# Patient Record
Sex: Female | Born: 1985 | Race: Black or African American | Hispanic: No | Marital: Single | Smoking: Former smoker
Health system: Southern US, Community
[De-identification: ages and names within clinical notes are randomized; demographics above are authoritative.]

## PROBLEM LIST (undated history)

## (undated) DIAGNOSIS — F32A Depression, unspecified: Secondary | ICD-10-CM

## (undated) DIAGNOSIS — D649 Anemia, unspecified: Secondary | ICD-10-CM

## (undated) DIAGNOSIS — O24419 Gestational diabetes mellitus in pregnancy, unspecified control: Secondary | ICD-10-CM

## (undated) DIAGNOSIS — D219 Benign neoplasm of connective and other soft tissue, unspecified: Secondary | ICD-10-CM

## (undated) DIAGNOSIS — F419 Anxiety disorder, unspecified: Secondary | ICD-10-CM

## (undated) HISTORY — PX: OTHER SURGICAL HISTORY: SHX169

## (undated) HISTORY — DX: Depression, unspecified: F32.A

## (undated) HISTORY — DX: Benign neoplasm of connective and other soft tissue, unspecified: D21.9

## (undated) HISTORY — DX: Anemia, unspecified: D64.9

## (undated) HISTORY — DX: Gestational diabetes mellitus in pregnancy, unspecified control: O24.419

## (undated) HISTORY — DX: Anxiety disorder, unspecified: F41.9

---

## 2017-01-01 ENCOUNTER — Encounter: Payer: Self-pay | Admitting: Women's Health

## 2017-01-01 ENCOUNTER — Ambulatory Visit (INDEPENDENT_AMBULATORY_CARE_PROVIDER_SITE_OTHER): Payer: Medicaid Other | Admitting: Women's Health

## 2017-01-01 VITALS — BP 118/66 | HR 84 | Ht 66.0 in | Wt 197.0 lb

## 2017-01-01 DIAGNOSIS — Z30011 Encounter for initial prescription of contraceptive pills: Secondary | ICD-10-CM

## 2017-01-01 DIAGNOSIS — Z3202 Encounter for pregnancy test, result negative: Secondary | ICD-10-CM

## 2017-01-01 DIAGNOSIS — Z332 Encounter for elective termination of pregnancy: Secondary | ICD-10-CM

## 2017-01-01 LAB — POCT URINE PREGNANCY: Preg Test, Ur: NEGATIVE

## 2017-01-01 MED ORDER — NORGESTIMATE-ETH ESTRADIOL 0.25-35 MG-MCG PO TABS: 1.0000 | ORAL_TABLET | Freq: Every day | ORAL | 11 refills | Status: DC

## 2017-01-01 NOTE — Patient Instructions (Signed)
Oral Contraception Use Oral contraceptive pills (OCPs) are medicines taken to prevent pregnancy. OCPs work by preventing the ovaries from releasing eggs. The hormones in OCPs also cause the cervical mucus to thicken, preventing the sperm from entering the uterus. The hormones also cause the uterine lining to become thin, not allowing a fertilized egg to attach to the inside of the uterus. OCPs are highly effective when taken exactly as prescribed. However, OCPs do not prevent sexually transmitted diseases (STDs). Safe sex practices, such as using condoms along with an OCP, can help prevent STDs. Before taking OCPs, you may have a physical exam and Pap test. Your health care provider may also order blood tests if necessary. Your health care provider will make sure you are a good candidate for oral contraception. Discuss with your health care provider the possible side effects of the OCP you may be prescribed. When starting an OCP, it can take 2 to 3 months for the body to adjust to the changes in hormone levels in your body. How to take oral contraceptive pills Your health care provider may advise you on how to start taking the first cycle of OCPs. Otherwise, you can:  Start on day 1 of your menstrual period. You will not need any backup contraceptive protection with this start time.  Start on the first Sunday after your menstrual period or the day you get your prescription. In these cases, you will need to use backup contraceptive protection for the first week.  Start the pill at any time of your cycle. If you take the pill within 5 days of the start of your period, you are protected against pregnancy right away. In this case, you will not need a backup form of birth control. If you start at any other time of your menstrual cycle, you will need to use another form of birth control for 7 days. If your OCP is the type called a minipill, it will protect you from pregnancy after taking it for 2 days (48  hours).  After you have started taking OCPs:  If you forget to take 1 pill, take it as soon as you remember. Take the next pill at the regular time.  If you miss 2 or more pills, call your health care provider because different pills have different instructions for missed doses. Use backup birth control until your next menstrual period starts.  If you use a 28-day pack that contains inactive pills and you miss 1 of the last 7 pills (pills with no hormones), it will not matter. Throw away the rest of the non-hormone pills and start a new pill pack.  No matter which day you start the OCP, you will always start a new pack on that same day of the week. Have an extra pack of OCPs and a backup contraceptive method available in case you miss some pills or lose your OCP pack. Follow these instructions at home:  Do not smoke.  Always use a condom to protect against STDs. OCPs do not protect against STDs.  Use a calendar to mark your menstrual period days.  Read the information and directions that came with your OCP. Talk to your health care provider if you have questions. Contact a health care provider if:  You develop nausea and vomiting.  You have abnormal vaginal discharge or bleeding.  You develop a rash.  You miss your menstrual period.  You are losing your hair.  You need treatment for mood swings or depression.  You   get dizzy when taking the OCP.  You develop acne from taking the OCP.  You become pregnant. Get help right away if:  You develop chest pain.  You develop shortness of breath.  You have an uncontrolled or severe headache.  You develop numbness or slurred speech.  You develop visual problems.  You develop pain, redness, and swelling in the legs. This information is not intended to replace advice given to you by your health care provider. Make sure you discuss any questions you have with your health care provider. Document Released: 08/03/2011 Document  Revised: 01/20/2016 Document Reviewed: 02/02/2013 Elsevier Interactive Patient Education  2017 Elsevier Inc.  

## 2017-01-01 NOTE — Progress Notes (Signed)
   [facility] Visit  Patient name: Emma Howard MRN [id number]  Date of birth: 01/05/1986  CC & HPI:  Emma Howard is a 31 y.o. [id number] African American female presenting today for f/u [place] Was told to f/u w/ OB/GYN after. This is her first appt here w/ us. Has had multiple elective Ab's. Bled x 1wk after, no pain or problems. Was taking Sprintec, but ran out sometime in April. Bled x 2 days on 4/7 & 4/8 while on white pills. Has had unprotected sex since, last 12/18/16. Wants refill on Sprintec. Does not smoke, no h/o HTN, DVT/PE, CVA, MI, or migraines w/ aura.  Patient's last menstrual period was 12/02/2016 (exact date). The current method of family planning is none. Last pap >7959yrs ago  Pertinent History Reviewed:  Medical & Surgical Hx:   Past medical, surgical, family, and social history reviewed in electronic medical record Medications: Reviewed & Updated - see associated section Allergies: Reviewed in electronic medical record  Objective Findings:  Vitals: BP 118/66 (BP Location: Right Arm, Patient Position: Sitting, Cuff Size: Normal)   Pulse 84   Ht 5\' 6"  (1.676 m)   Wt 197 lb (89.4 kg)   LMP 12/02/2016 (Exact Date)   BMI 31.80 kg/m  Body mass index is 31.8 kg/m.  Physical Examination: General appearance - alert, well appearing, and in no distress HRRR LCTAB Skin warm and dry Abd no tenderness Results for orders placed or performed in visit on 01/01/17 (from the past 24 hour(s))  POCT urine pregnancy   Collection Time: 01/01/17  9:31 AM  Result Value Ref Range   Preg Test, Ur Negative Negative     Assessment & Plan:  A:   New pt, f/u 2mths s/p elective Ab  Contraception management  Needs pap  P:  Rx sprintec w 11RF- take daily as directed to prevent pregnancy  Return in about 3 months (around 04/03/2017) for Pap & physical. and f/u on coc's  Marge DuncansBooker, Kennley Schwandt Randall CNM, [facility] 01/01/2017 9:33 AM

## 2017-04-03 ENCOUNTER — Other Ambulatory Visit: Payer: Medicaid Other | Admitting: Adult Health

## 2018-06-10 ENCOUNTER — Other Ambulatory Visit: Payer: Self-pay | Admitting: Women's Health

## 2018-07-15 ENCOUNTER — Other Ambulatory Visit: Payer: Self-pay

## 2018-07-15 ENCOUNTER — Encounter (HOSPITAL_COMMUNITY): Payer: Self-pay | Admitting: Emergency Medicine

## 2018-07-15 ENCOUNTER — Emergency Department (HOSPITAL_COMMUNITY)
Admission: EM | Admit: 2018-07-15 | Discharge: 2018-07-15 | Disposition: A | Discharge status: Home / Self Care | Payer: Medicaid Other | Attending: Emergency Medicine | Admitting: Emergency Medicine

## 2018-07-15 DIAGNOSIS — J111 Influenza due to unidentified influenza virus with other respiratory manifestations: Secondary | ICD-10-CM | POA: Diagnosis not present

## 2018-07-15 DIAGNOSIS — Z79899 Other long term (current) drug therapy: Secondary | ICD-10-CM | POA: Insufficient documentation

## 2018-07-15 DIAGNOSIS — R52 Pain, unspecified: Secondary | ICD-10-CM | POA: Diagnosis present

## 2018-07-15 DIAGNOSIS — R69 Illness, unspecified: Secondary | ICD-10-CM

## 2018-07-15 DIAGNOSIS — Z87891 Personal history of nicotine dependence: Secondary | ICD-10-CM | POA: Insufficient documentation

## 2018-07-15 MED ORDER — BENZONATATE 200 MG PO CAPS: 200.0000 mg | ORAL_CAPSULE | Freq: Three times a day (TID) | ORAL | 0 refills | Status: DC | PRN

## 2018-07-15 MED ORDER — ACETAMINOPHEN 325 MG PO TABS
650.0000 mg | ORAL_TABLET | Freq: Once | ORAL | Status: AC
  Administered 2018-07-15: 650 mg via ORAL
  Filled 2018-07-15: qty 2

## 2018-07-15 MED ORDER — OSELTAMIVIR PHOSPHATE 75 MG PO CAPS: 75.0000 mg | ORAL_CAPSULE | Freq: Two times a day (BID) | ORAL | 0 refills | Status: DC

## 2018-07-15 NOTE — Discharge Instructions (Addendum)
Is important to drink plenty of fluids.  Alternate Tylenol and your Aleve for fever.  You can take Tylenol every 4 hours Aleve twice a day.  Take the Tamiflu as directed until its finished.  Follow-up with 1 of the clinics listed or your primary provider if needed.  Return to the ER for any worsening symptoms.

## 2018-07-15 NOTE — ED Triage Notes (Signed)
Pt C/O body aches and fever that started last night around 2100. Pt states she had the flu in March of this year.

## 2018-07-15 NOTE — ED Provider Notes (Signed)
[facility] EMERGENCY DEPARTMENT Provider Note   CSN: [id number] Arrival date & time: 07/15/18  1929     History   Chief Complaint Chief Complaint  Patient presents with  . Influenza    HPI Emma Howard is a 32 y.o. female.  HPI   Emma Howard is a 32 y.o. female who presents to the Emergency Department complaining of sudden onset of generalized body aches, fever, chills, and mild nasal congestion.  Symptoms began last evening.  She is taken 2 doses of Aleve without relief.  She states that she had "the flu" earlier this year and her current symptoms feel similar.  She also has a family member that is here with similar symptoms.  She denies chest pain, abdominal pain, vomiting, shortness of breath and neck pain or stiffness  There are no active problems to display for this patient.   History reviewed. No pertinent surgical history.   OB History    Gravida  7   Para  4   Term      Preterm      AB  3   Living        SAB      TAB  3   Ectopic      Multiple      Live Births               Home Medications    Prior to Admission medications   Medication Sig Start Date End Date Taking? Authorizing Provider  SPRINTEC 28 0.25-35 MG-MCG tablet TAKE 1 TABLET BY MOUTH ONCE DAILY 06/10/18   Cheral MarkerBooker, Kimberly R, CNM    Family History Family History  Problem Relation Age of Onset  . Schizophrenia Mother   . Diabetes Son   . Asthma Son   . Asthma Son     Social History Social History   Tobacco Use  . Smoking status: Former Games developermoker  . Smokeless tobacco: Never Used  Substance Use Topics  . Alcohol use: No  . Drug use: No     Allergies   Patient has no known allergies.   Review of Systems Review of Systems  Constitutional: Positive for chills and fever. Negative for activity change and appetite change.  HENT: Positive for congestion. Negative for facial swelling, rhinorrhea, sore throat and trouble swallowing.   Eyes: Negative for visual  disturbance.  Respiratory: Positive for cough. Negative for chest tightness, shortness of breath, wheezing and stridor.   Cardiovascular: Negative for chest pain.  Gastrointestinal: Negative for abdominal pain, nausea and vomiting.  Musculoskeletal: Positive for myalgias. Negative for neck pain and neck stiffness.  Skin: Negative for rash.  Neurological: Negative for dizziness, weakness, numbness and headaches.  Hematological: Negative for adenopathy.  Psychiatric/Behavioral: Negative for confusion.     Physical Exam Updated Vital Signs BP 117/77 (BP Location: Right Arm)   Pulse 97   Temp 100.2 F (37.9 C) (Oral)   Resp 14   LMP 06/05/2018   SpO2 100%   Physical Exam  Constitutional: She appears well-developed and well-nourished. No distress.  HENT:  Head: Normocephalic and atraumatic.  Right Ear: Tympanic membrane and ear canal normal.  Left Ear: Tympanic membrane and ear canal normal.  Nose: Mucosal edema present. No rhinorrhea.  Mouth/Throat: Uvula is midline and mucous membranes are normal. No trismus in the jaw. No uvula swelling. Posterior oropharyngeal erythema present. No oropharyngeal exudate, posterior oropharyngeal edema or tonsillar abscesses.  Eyes: Conjunctivae are normal.  Neck: Normal range of motion and phonation  normal. Neck supple. No Kernig's sign noted.  Cardiovascular: Normal rate and intact distal pulses.  No murmur heard. Mild tachycardia  Pulmonary/Chest: Effort normal and breath sounds normal. No respiratory distress. She has no wheezes. She has no rales. She exhibits no tenderness.  Abdominal: Soft. She exhibits no distension. There is no tenderness. There is no rebound and no guarding.  Musculoskeletal: She exhibits no edema.  Lymphadenopathy:    She has no cervical adenopathy.  Neurological: She is alert. No sensory deficit.  Skin: Skin is warm and dry.  Psychiatric: She has a normal mood and affect.  Nursing note and vitals reviewed.    ED  Treatments / Results  Labs (all labs ordered are listed, but only abnormal results are displayed) Labs Reviewed - No data to display  EKG None  Radiology No results found.  Procedures Procedures (including critical care time)  Medications Ordered in ED Medications  acetaminophen (TYLENOL) tablet 650 mg (650 mg Oral Given 07/15/18 1936)     Initial Impression / Assessment and Plan / ED Course  I have reviewed the triage vital signs and the nursing notes.  Pertinent labs & imaging results that were available during my care of the patient were reviewed by me and considered in my medical decision making (see chart for details).     Mucous membranes are moist, patient is nontoxic-appearing.  Symptoms likely related to influenza.  Patient is requesting prescription for Tamiflu.  Agrees to treatment with Tylenol and Aleve.  Vitals improved after po fluids and tylenol.  She appears appropriate for discharge home, return precautions discussed.  Final Clinical Impressions(s) / ED Diagnoses   Final diagnoses:  Influenza-like illness    ED Discharge Orders    None       Pauline Aus, PA-C 07/17/18 0010    Long, Arlyss Repress, MD 07/17/18 1302

## 2019-08-12 ENCOUNTER — Emergency Department (HOSPITAL_COMMUNITY)
Admission: EM | Admit: 2019-08-12 | Discharge: 2019-08-12 | Disposition: A | Discharge status: Home / Self Care | Payer: Medicaid Other | Attending: Emergency Medicine | Admitting: Emergency Medicine

## 2019-08-12 ENCOUNTER — Ambulatory Visit
Admission: EM | Admit: 2019-08-12 | Discharge: 2019-08-12 | Disposition: A | Discharge status: Home / Self Care | Payer: Medicaid Other

## 2019-08-12 ENCOUNTER — Encounter: Payer: Self-pay | Admitting: Urgent Care

## 2019-08-12 ENCOUNTER — Other Ambulatory Visit: Payer: Self-pay

## 2019-08-12 ENCOUNTER — Emergency Department (HOSPITAL_COMMUNITY): Payer: Medicaid Other

## 2019-08-12 ENCOUNTER — Encounter (HOSPITAL_COMMUNITY): Payer: Self-pay | Admitting: *Deleted

## 2019-08-12 DIAGNOSIS — W2203XA Walked into furniture, initial encounter: Secondary | ICD-10-CM | POA: Diagnosis not present

## 2019-08-12 DIAGNOSIS — Y939 Activity, unspecified: Secondary | ICD-10-CM | POA: Diagnosis not present

## 2019-08-12 DIAGNOSIS — S92512A Displaced fracture of proximal phalanx of left lesser toe(s), initial encounter for closed fracture: Secondary | ICD-10-CM | POA: Insufficient documentation

## 2019-08-12 DIAGNOSIS — Z87891 Personal history of nicotine dependence: Secondary | ICD-10-CM | POA: Diagnosis not present

## 2019-08-12 DIAGNOSIS — Z79899 Other long term (current) drug therapy: Secondary | ICD-10-CM | POA: Insufficient documentation

## 2019-08-12 DIAGNOSIS — Y929 Unspecified place or not applicable: Secondary | ICD-10-CM | POA: Diagnosis not present

## 2019-08-12 DIAGNOSIS — Y999 Unspecified external cause status: Secondary | ICD-10-CM | POA: Insufficient documentation

## 2019-08-12 DIAGNOSIS — S99922A Unspecified injury of left foot, initial encounter: Secondary | ICD-10-CM | POA: Diagnosis present

## 2019-08-12 MED ORDER — IBUPROFEN 600 MG PO TABS: 600.0000 mg | ORAL_TABLET | Freq: Four times a day (QID) | ORAL | 0 refills | Status: DC | PRN

## 2019-08-12 NOTE — Discharge Instructions (Addendum)
Wear the buddy taping to protect your toe, change daily, and use the post op shoe to avoid bending the toe when you walk.

## 2019-08-12 NOTE — ED Provider Notes (Signed)
[facility] EMERGENCY DEPARTMENT Provider Note   CSN: [id number] Arrival date & time: 08/12/19  1803     History Chief Complaint  Patient presents with  . Toe Injury    Emma Howard is a 33 y.o. female presenting with a 40 history of left fifth toe pain from an injury she sustained when she accidentally kicked a table leg.  She reports persistent pain with movement of the toe and with ambulation.   She has used warm water soaks and ibuprofen with improvement in her pain symptoms.  She endorses numbness in the toe intermittently, notices it more when she crosses her legs.  She denies weakness or numbness proximal to the injury site.   HPI     History reviewed. No pertinent past medical history.  There are no problems to display for this patient.   History reviewed. No pertinent surgical history.   OB History    Gravida  7   Para  4   Term      Preterm      AB  3   Living        SAB      TAB  3   Ectopic      Multiple      Live Births              Family History  Problem Relation Age of Onset  . Schizophrenia Mother   . Diabetes Son   . Asthma Son   . Asthma Son     Social History   Tobacco Use  . Smoking status: Former Games developer  . Smokeless tobacco: Never Used  Substance Use Topics  . Alcohol use: No  . Drug use: No    Home Medications Prior to Admission medications   Medication Sig Start Date End Date Taking? Authorizing Provider  benzonatate (TESSALON) 200 MG capsule Take 1 capsule (200 mg total) by mouth 3 (three) times daily as needed for cough. Swallow whole, do not chew 07/15/18   Triplett, Tammy, PA-C  ibuprofen (ADVIL) 600 MG tablet Take 1 tablet (600 mg total) by mouth every 6 (six) hours as needed. 08/12/19   Burgess Amor, PA-C  oseltamivir (TAMIFLU) 75 MG capsule Take 1 capsule (75 mg total) by mouth 2 (two) times daily. 07/15/18   Pauline Aus, PA-C  SPRINTEC 28 0.25-35 MG-MCG tablet TAKE 1 TABLET BY MOUTH ONCE DAILY  06/10/18   Cheral Marker, CNM    Allergies    Patient has no known allergies.  Review of Systems   Review of Systems  Constitutional: Negative for fever.  Musculoskeletal: Positive for arthralgias and joint swelling. Negative for myalgias.  Neurological: Negative for weakness and numbness.    Physical Exam Updated Vital Signs BP 120/79 (BP Location: Right Arm)   Pulse 70   Temp 98.1 F (36.7 C) (Oral)   Resp 16   Ht 5\' 4"  (1.626 m)   Wt 88.5 kg   LMP 08/07/2019   SpO2 100%   BMI 33.47 kg/m   Physical Exam Constitutional:      Appearance: She is well-developed.  HENT:     Head: Atraumatic.  Cardiovascular:     Comments: Pulses equal bilaterally Musculoskeletal:        General: Tenderness and signs of injury present. No deformity.     Cervical back: Normal range of motion.     Left foot: Bony tenderness present.     Comments: Bony tenderness at the left fifth toe with no  obvious deformity.  Distal sensation is intact.  No proximal foot or ankle pain.  Skin is intact.  Skin:    General: Skin is warm and dry.  Neurological:     Mental Status: She is alert.     Sensory: No sensory deficit.     Deep Tendon Reflexes: Reflexes normal.     ED Results / Procedures / Treatments   Labs (all labs ordered are listed, but only abnormal results are displayed) Labs Reviewed - No data to display  EKG None  Radiology DG Foot Complete Left  Result Date: 08/12/2019 CLINICAL DATA:  The patient suffered a blow to the left little toe on a table 4 days ago. Pain since the incident. Initial encounter. EXAM: LEFT FOOT - COMPLETE 3+ VIEW COMPARISON:  None. FINDINGS: The patient has an acute fracture of the proximal phalanx of the left little toe. The fracture extends from the distal diaphysis on the lateral side through the metaphysis on the medial side. There is slight lateral displacement of the distal fragment. The fracture does not disrupt an articular surface. IMPRESSION:  Acute fracture proximal phalanx left little toe as described. Electronically Signed   By: Inge Rise M.D.   On: 08/12/2019 19:55    Procedures Procedures (including critical care time)  Medications Ordered in ED Medications - No data to display  ED Course  I have reviewed the triage vital signs and the nursing notes.  Pertinent labs & imaging results that were available during my care of the patient were reviewed by me and considered in my medical decision making (see chart for details).    MDM Rules/Calculators/A&P                      Imaging reviewed and discussed with patient.  She was given buddy taping and instructed in daily home use of this, postop shoe provided.  We also discussed heat therapy, continue ibuprofen for symptom relief.  Referral to orthopedics for follow-up care. Final Clinical Impression(s) / ED Diagnoses Final diagnoses:  Closed displaced fracture of proximal phalanx of lesser toe of left foot, initial encounter    Rx / DC Orders ED Discharge Orders         Ordered    ibuprofen (ADVIL) 600 MG tablet  Every 6 hours PRN     08/12/19 2028           Evalee Jefferson, PA-C 08/12/19 2049    Veryl Speak, MD 08/12/19 2315

## 2019-08-12 NOTE — ED Triage Notes (Signed)
Left pinky toe injury, hit against a table 4 days ago

## 2019-08-13 ENCOUNTER — Encounter: Payer: Self-pay | Admitting: Orthopedic Surgery

## 2019-08-13 ENCOUNTER — Ambulatory Visit: Payer: Medicaid Other | Admitting: Orthopedic Surgery

## 2019-08-13 VITALS — BP 113/79 | HR 61 | Ht 64.0 in | Wt 196.0 lb

## 2019-08-13 DIAGNOSIS — S92515A Nondisplaced fracture of proximal phalanx of left lesser toe(s), initial encounter for closed fracture: Secondary | ICD-10-CM

## 2019-08-13 NOTE — Progress Notes (Signed)
NEW PROBLEM//OFFICE VISIT  Chief Complaint  Patient presents with  . Toe Injury    left fifth toe vs furniture 08/08/19    33 year old female hit her left foot against a piece of furniture on the 11th injured her left small toe trying to treat it at home with soaking in limping but x-rays showed a proximal phalanx fracture when she went to the ER.  She comes in with minimal discomfort she is in a postop shoe seems to be doing well with that     Review of Systems  Neurological: Negative for tingling and sensory change.     History reviewed. No pertinent past medical history.  Past Surgical History:  Procedure Laterality Date  . none      Family History  Problem Relation Age of Onset  . Schizophrenia Mother   . Diabetes Son   . Asthma Son   . Asthma Son    Social History   Tobacco Use  . Smoking status: Former Research scientist (life sciences)  . Smokeless tobacco: Never Used  Substance Use Topics  . Alcohol use: No  . Drug use: No    No Known Allergies  No outpatient medications have been marked as taking for the 08/13/19 encounter (Office Visit) with Carole Civil, MD.    BP 113/79   Pulse 61   Ht 5\' 4"  (1.626 m)   Wt 196 lb (88.9 kg)   LMP 08/07/2019   BMI 33.64 kg/m   Physical Exam Constitutional:      Appearance: Normal appearance.  Musculoskeletal:     Comments: Fairly severe flatfoot deformity with severe pronation with tenderness of the proximal phalanx of the left foot and swelling but no deformity passive motion does hurt of course.  Skin:    General: Skin is warm.     Capillary Refill: Capillary refill takes less than 2 seconds.  Neurological:     Mental Status: She is alert.     Gait: Gait abnormal.  Psychiatric:        Mood and Affect: Mood normal.     Ortho Exam    MEDICAL DECISION SECTION  Imaging: Radiographs were done at [facility] The report has been reviewed.  I have made an independent evaluation and interpretation of the imaging:  Nondisplaced proximal phalanx fracture left foot fifth digit  Prior records including all forwarded office visits plus or minus emergency room visits have been reviewed and confirm the history given by the patient.   Encounter Diagnosis  Name Primary?  . Closed nondisplaced fracture of proximal phalanx of lesser toe of left foot, initial encounter Yes    No orders of the defined types were placed in this encounter.    Note to avoid truck driving school  Follow-up 4 weeks  Wear the postop shoe next 4 weeks but okay to transition to regular shoe if foot gets comfortable   Arther Abbott, MD  08/13/2019 2:14 PM

## 2019-08-13 NOTE — Patient Instructions (Addendum)
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

## 2019-09-10 ENCOUNTER — Encounter: Payer: Self-pay | Admitting: Orthopedic Surgery

## 2019-09-10 ENCOUNTER — Other Ambulatory Visit: Payer: Self-pay

## 2019-09-10 ENCOUNTER — Ambulatory Visit (INDEPENDENT_AMBULATORY_CARE_PROVIDER_SITE_OTHER): Payer: Medicaid Other

## 2019-09-10 ENCOUNTER — Ambulatory Visit: Payer: Medicaid Other | Admitting: Orthopedic Surgery

## 2019-09-10 VITALS — BP 112/65 | HR 73 | Temp 97.6°F | Ht 64.0 in | Wt 196.0 lb

## 2019-09-10 DIAGNOSIS — S92515D Nondisplaced fracture of proximal phalanx of left lesser toe(s), subsequent encounter for fracture with routine healing: Secondary | ICD-10-CM

## 2019-09-10 NOTE — Patient Instructions (Signed)
Note for work out for Dr visit   Regular shoewear   Follow up if any problems   The fracture healed

## 2019-09-10 NOTE — Progress Notes (Signed)
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  She has some small amount of residual tenderness but the toe looks good it moves fine  X-ray shows fracture healing  Patient was released

## 2020-06-21 ENCOUNTER — Other Ambulatory Visit: Payer: Self-pay

## 2020-06-21 ENCOUNTER — Ambulatory Visit
Admission: EM | Admit: 2020-06-21 | Discharge: 2020-06-21 | Disposition: A | Discharge status: Home / Self Care | Payer: Medicaid Other | Attending: Family Medicine | Admitting: Family Medicine

## 2020-06-21 DIAGNOSIS — J988 Other specified respiratory disorders: Secondary | ICD-10-CM | POA: Insufficient documentation

## 2020-06-21 DIAGNOSIS — Z1152 Encounter for screening for COVID-19: Secondary | ICD-10-CM | POA: Insufficient documentation

## 2020-06-21 DIAGNOSIS — B9789 Other viral agents as the cause of diseases classified elsewhere: Secondary | ICD-10-CM | POA: Insufficient documentation

## 2020-06-21 LAB — POCT RAPID STREP A (OFFICE): Rapid Strep A Screen: NEGATIVE

## 2020-06-21 NOTE — ED Provider Notes (Signed)
[facility]    CSN: [id number] Arrival date & time: 06/21/20  1704      History   Chief Complaint Chief Complaint  Patient presents with  . Sore Throat    HPI Emma Howard is a 34 y.o. female.   HPI  Sore throat nasal congestion x3 days.  Patient has been afebrile.  Her son was evaluated for symptoms concerning for Covid and she was also advised to get tested today.  She has been afebrile.  Has been managing symptoms with over-the-counter Robitussin and vitamin supplements to help boost immune system.  She denies any chest pain, shortness of breath or wheezing.  History reviewed. No pertinent past medical history.  There are no problems to display for this patient.   Past Surgical History:  Procedure Laterality Date  . none      OB History    Gravida  7   Para  4   Term      Preterm      AB  3   Living        SAB      TAB  3   Ectopic      Multiple      Live Births               Home Medications    Prior to Admission medications   Medication Sig Start Date End Date Taking? Authorizing Provider  ibuprofen (ADVIL) 600 MG tablet Take 1 tablet (600 mg total) by mouth every 6 (six) hours as needed. 08/12/19   Burgess Amor, PA-C  SPRINTEC 28 0.25-35 MG-MCG tablet TAKE 1 TABLET BY MOUTH ONCE DAILY 06/10/18   Cheral Marker, CNM    Family History Family History  Problem Relation Age of Onset  . Schizophrenia Mother   . Diabetes Son   . Asthma Son   . Asthma Son     Social History Social History   Tobacco Use  . Smoking status: Former Games developer  . Smokeless tobacco: Never Used  Substance Use Topics  . Alcohol use: No  . Drug use: No     Allergies   Patient has no known allergies.   Review of Systems Review of Systems Pertinent negatives listed in HPI   Physical Exam Triage Vital Signs ED Triage Vitals  Enc Vitals Group     BP 06/21/20 1757 118/83     Pulse Rate 06/21/20 1757 83     Resp 06/21/20 1757 20       Temp 06/21/20 1757 99.5 F (37.5 C)     Temp src --      SpO2 06/21/20 1757 98 %     Weight --      Height --      Head Circumference --      Peak Flow --      Pain Score 06/21/20 1756 5     Pain Loc --      Pain Edu? --      Excl. in GC? --    No data found.  Updated Vital Signs BP 118/83   Pulse 83   Temp 99.5 F (37.5 C)   Resp 20   LMP  (LMP Unknown)   SpO2 98%   Visual Acuity Right Eye Distance:   Left Eye Distance:   Bilateral Distance:    Right Eye Near:   Left Eye Near:    Bilateral Near:     Physical Exam General appearance: alert, well developed, well  nourished, cooperative and in no distress Head: Normocephalic, without obvious abnormality, atraumatic ENT: Nasal congestion, mucosal edema, oropharynx normal. Respiratory: Respirations even and unlabored, normal respiratory rate Heart: rate and rhythm normal. No gallop or murmurs noted on exam  Extremities: No gross deformities Skin: Skin color, texture, turgor normal. No rashes seen  Psych: Appropriate mood and affect. UC Treatments / Results  Labs (all labs ordered are listed, but only abnormal results are displayed) Labs Reviewed  CULTURE, GROUP A STREP ([facility])  NOVEL CORONAVIRUS, NAA  POCT RAPID STREP A (OFFICE)    EKG   Radiology No results found.  Procedures Procedures (including critical care time)  Medications Ordered in UC Medications - No data to display  Initial Impression / Assessment and Plan / UC Course  I have reviewed the triage vital signs and the nursing notes.  Pertinent labs & imaging results that were available during my care of the patient were reviewed by me and considered in my medical decision making (see chart for details).    COVID-19 test pending.  Symptoms are consistent with a viral etiology.  Recommended symptomatic management only.  Patient encouraged to activate MyChart for ease of access to Covid test results.  Rapid strep is negative.  Throat culture  pending.  Follow-up with primary care if symptoms worsen or do not improve. Final Clinical Impressions(s) / UC Diagnoses   Final diagnoses:  Viral respiratory illness     Discharge Instructions     Activate MyChart for ease of access to COVID-19 test once it results.    ED Prescriptions    None     PDMP not reviewed this encounter.   Bing Neighbors, FNP 06/21/20 [phone]

## 2020-06-21 NOTE — ED Triage Notes (Signed)
Pt presents with c/o sore throat for past 3 days , no fever

## 2020-06-21 NOTE — Discharge Instructions (Addendum)
Activate MyChart for ease of access to COVID-19 test once it results.

## 2020-06-22 LAB — NOVEL CORONAVIRUS, NAA

## 2020-06-23 ENCOUNTER — Ambulatory Visit
Admission: EM | Admit: 2020-06-23 | Discharge: 2020-06-23 | Disposition: A | Discharge status: Home / Self Care | Payer: Medicaid Other | Attending: Emergency Medicine | Admitting: Emergency Medicine

## 2020-06-23 DIAGNOSIS — Z1152 Encounter for screening for COVID-19: Secondary | ICD-10-CM

## 2020-06-23 NOTE — ED Triage Notes (Signed)
Recollection needed per [facility]

## 2020-06-24 LAB — CULTURE, GROUP A STREP (THRC)

## 2020-06-24 LAB — SARS-COV-2, NAA 2 DAY TAT

## 2020-06-24 LAB — NOVEL CORONAVIRUS, NAA: SARS-CoV-2, NAA: DETECTED — AB

## 2020-06-25 ENCOUNTER — Telehealth (HOSPITAL_COMMUNITY): Payer: Self-pay

## 2020-06-25 NOTE — Telephone Encounter (Signed)
Called to Discuss with patient about Covid symptoms and the use of the monoclonal antibody infusion for those with mild to moderate Covid symptoms and at a high risk of hospitalization.     Pt refused antibody treatment.

## 2021-03-06 ENCOUNTER — Emergency Department (HOSPITAL_COMMUNITY)
Admission: EM | Admit: 2021-03-06 | Discharge: 2021-03-06 | Disposition: A | Discharge status: Home / Self Care | Payer: Medicaid Other | Attending: Emergency Medicine | Admitting: Emergency Medicine

## 2021-03-06 ENCOUNTER — Other Ambulatory Visit: Payer: Self-pay

## 2021-03-06 ENCOUNTER — Encounter (HOSPITAL_COMMUNITY): Payer: Self-pay

## 2021-03-06 DIAGNOSIS — Z87891 Personal history of nicotine dependence: Secondary | ICD-10-CM | POA: Insufficient documentation

## 2021-03-06 DIAGNOSIS — M545 Low back pain, unspecified: Secondary | ICD-10-CM | POA: Insufficient documentation

## 2021-03-06 LAB — URINALYSIS, ROUTINE W REFLEX MICROSCOPIC
Bilirubin Urine: NEGATIVE
Glucose, UA: NEGATIVE mg/dL
Hgb urine dipstick: NEGATIVE
Ketones, ur: NEGATIVE mg/dL
Leukocytes,Ua: NEGATIVE
Nitrite: NEGATIVE
Protein, ur: NEGATIVE mg/dL
Specific Gravity, Urine: 1.018 (ref 1.005–1.030)
pH: 7 (ref 5.0–8.0)

## 2021-03-06 LAB — PREGNANCY, URINE: Preg Test, Ur: NEGATIVE

## 2021-03-06 MED ORDER — PREDNISONE 10 MG PO TABS: 30.0000 mg | ORAL_TABLET | Freq: Every day | ORAL | 0 refills | Status: AC

## 2021-03-06 MED ORDER — CYCLOBENZAPRINE HCL 10 MG PO TABS: 10.0000 mg | ORAL_TABLET | Freq: Two times a day (BID) | ORAL | 0 refills | Status: DC | PRN

## 2021-03-06 NOTE — ED Triage Notes (Signed)
Pt presents to ED with complaints of left sided lower back pain started yesterday evening, denies urinary symptoms.

## 2021-03-06 NOTE — ED Provider Notes (Signed)
[facility] EMERGENCY DEPARTMENT Provider Note   CSN: [id number] Arrival date & time: 03/06/21  1302     History Chief Complaint  Patient presents with   Back Pain    Emma Howard is a 35 y.o. female.  HPI  Patient with no significant medical history presents to the emergency room with chief complaint of left-sided back pain.  Patient states this started last night, pain came on suddenly, she states the pain was in her lower back does not radiate down to her legs, she has no paresthesia or weakness in the lower extremities, she denies urinary incontinence, retention, difficult with  bowel movements, she denies  urinary symptoms at this time.  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  She denies history of IV drug use, denies associated fevers or chills, states it worsened with twisting and bending over, states laying still improves the pain.  She denies alleviating factors.  History reviewed. No pertinent past medical history.  There are no problems to display for this patient.   Past Surgical History:  Procedure Laterality Date   none       OB History     Gravida  7   Para  4   Term      Preterm      AB  3   Living         SAB      IAB  3   Ectopic      Multiple      Live Births              Family History  Problem Relation Age of Onset   Schizophrenia Mother    Diabetes Son    Asthma Son    Asthma Son     Social History   Tobacco Use   Smoking status: Former    Pack years: 0.00   Smokeless tobacco: Never  Substance Use Topics   Alcohol use: No   Drug use: No    Home Medications Prior to Admission medications   Medication Sig Start Date End Date Taking? Authorizing Provider  cyclobenzaprine (FLEXERIL) 10 MG tablet Take 1 tablet (10 mg total) by mouth 2 (two) times daily as needed for muscle spasms. 03/06/21  Yes  Carroll Sage, PA-C  naproxen sodium (ALEVE) 220 MG tablet Take 220 mg by mouth.   Yes [provider]  predniSONE (DELTASONE) 10 MG tablet Take 3 tablets (30 mg total) by mouth daily for 4 days. 03/06/21 03/10/21 Yes Carroll Sage, PA-C  ibuprofen (ADVIL) 600 MG tablet Take 1 tablet (600 mg total) by mouth every 6 (six) hours as needed. Patient not taking: No sig reported 08/12/19   Burgess Amor, PA-C  SPRINTEC 28 0.25-35 MG-MCG tablet TAKE 1 TABLET BY MOUTH ONCE DAILY Patient not taking: No sig reported 06/10/18   Cheral Marker, CNM    Allergies    Patient has no known allergies.  Review of Systems   Review of Systems  Constitutional:  Negative for chills and fever.  HENT:  Negative for congestion.   Respiratory:  Negative for shortness of breath.   Cardiovascular:  Negative for chest pain.  Gastrointestinal:  Negative for abdominal pain.  Genitourinary:  Negative for enuresis.  Musculoskeletal:  Positive for back pain.  Skin:  Negative for rash.  Neurological:  Negative for dizziness.  Hematological:  Does not bruise/bleed easily.   Physical Exam Updated Vital Signs BP (!) 120/102 (BP Location: Right Arm)   Pulse 75   Temp 98.3 F (36.8 C) (Oral)   Resp 18   Ht 5\' 3"  (1.6 m)   Wt 97.5 kg   LMP 02/18/2021   SpO2 100%   BMI 38.09 kg/m   Physical Exam Vitals and nursing note reviewed.  Constitutional:      General: She is not in acute distress.    Appearance: She is not ill-appearing.  HENT:     Head: Normocephalic and atraumatic.     Nose: No congestion.  Eyes:     Conjunctiva/sclera: Conjunctivae normal.  Cardiovascular:     Rate and Rhythm: Normal rate and regular rhythm.     Pulses: Normal pulses.     Heart sounds: No murmur heard.   No friction rub. No gallop.  Pulmonary:     Effort: No respiratory distress.     Breath sounds: No wheezing, rhonchi or rales.  Musculoskeletal:     Comments: Spine was palpated was nontender to  palpation, she does have no tenderness along her left iliac crest.  No gross form is present.  Patient is full range of motion 5 of 5 strength neurovascular intact in the lower extremities.  She had positive right straight leg raise.  Skin:    General: Skin is warm and dry.  Neurological:     Mental Status: She is alert.  Psychiatric:        Mood and Affect: Mood normal.    ED Results / Procedures / Treatments   Labs (all labs ordered are listed, but only abnormal results are displayed) Labs Reviewed  URINALYSIS, ROUTINE W REFLEX MICROSCOPIC - Abnormal; Notable for the following components:      Result Value   APPearance HAZY (*)    All other components within normal limits  PREGNANCY, URINE    EKG None  Radiology No results found.  Procedures Procedures   Medications Ordered in ED Medications - No data to display  ED Course  I have reviewed the triage vital signs and the nursing notes.  Pertinent labs & imaging results that were available during my care of the patient were reviewed by me and considered in my medical decision making (see chart for details).    MDM Rules/Calculators/A&P                         Initial impression-patient presents with right lower back pain.  She is alert, does not appear acute stress, vital signs reassuring.  Will obtain UA urine pregnancy for further evaluation.  Work-up-UA unremarkable, urine pregnancy negative.  Rule out- I have low suspicion for spinal fracture or spinal cord abnormality as patient denies urinary incontinency, retention, difficulty with bowel movements, denies saddle paresthesias.  Spine was palpated there is no step-off, crepitus or gross deformities felt, patient had 5/5 strength, full range of motion, neurovascular fully intact in the lower extremities, will defer imaging at this time as there is no trauma associated this injury. . Low suspicion for septic arthritis as patient denies IV drug use, skin exam was  performed no erythematous, edema or warm joints noted.  Low suspicion for UTI, pyelonephritis, kidney stone as there is no signs of infection seen in UA, no hematuria, no CVA tenderness present patient denies urinary symptoms.   Plan-  Lower back pain-suspect secondary due to a  muscular strain, will provide patient with muscle relaxers, short course of steroids, follow-up with PCP as needed.  Vital signs have remained stable, no indication for hospital admission.  Patient given at home care as well strict return precautions.  Patient verbalized that they understood agreed to said plan.  Final Clinical Impression(s) / ED Diagnoses Final diagnoses:  Acute left-sided low back pain without sciatica    Rx / DC Orders ED Discharge Orders          Ordered    cyclobenzaprine (FLEXERIL) 10 MG tablet  2 times daily PRN        03/06/21 1533    predniSONE (DELTASONE) 10 MG tablet  Daily        03/06/21 1533             Barnie Del 03/06/21 1535    Vanetta Mulders, MD 03/08/21 [phone]

## 2021-03-06 NOTE — Discharge Instructions (Addendum)
You have been seen here for back pain.  Started you on steroids please take as prescribed.  Also given you a muscle relaxer please beware this medication make you drowsy do not consume alcohol or operate heavy machinery when taking this medication.  I recommend taking over-the-counter pain medications like ibuprofen and/or Tylenol every 6 as needed.  Please follow dosage and on the back of bottle.  I also recommend applying heat to the area and stretching out the muscles as this will help decrease stiffness and pain.  I have given you information on exercises please follow.  Please follow-up with your primary care provider for further evaluation.  Come back to the emergency department if you develop chest pain, shortness of breath, severe abdominal pain, uncontrolled nausea, vomiting, diarrhea.

## 2021-05-25 ENCOUNTER — Ambulatory Visit: Payer: Medicaid Other | Admitting: Orthopaedic Surgery

## 2021-12-16 ENCOUNTER — Emergency Department (HOSPITAL_COMMUNITY): Payer: Medicaid Other

## 2021-12-16 ENCOUNTER — Emergency Department (HOSPITAL_COMMUNITY)
Admission: EM | Admit: 2021-12-16 | Discharge: 2021-12-16 | Disposition: A | Discharge status: Home / Self Care | Payer: Medicaid Other | Attending: Emergency Medicine | Admitting: Emergency Medicine

## 2021-12-16 ENCOUNTER — Other Ambulatory Visit: Payer: Self-pay

## 2021-12-16 ENCOUNTER — Encounter (HOSPITAL_COMMUNITY): Payer: Self-pay

## 2021-12-16 DIAGNOSIS — R1011 Right upper quadrant pain: Secondary | ICD-10-CM | POA: Diagnosis not present

## 2021-12-16 DIAGNOSIS — R109 Unspecified abdominal pain: Secondary | ICD-10-CM | POA: Diagnosis present

## 2021-12-16 LAB — COMPREHENSIVE METABOLIC PANEL
ALT: 14 U/L (ref 0–44)
AST: 17 U/L (ref 15–41)
Albumin: 4 g/dL (ref 3.5–5.0)
Alkaline Phosphatase: 64 U/L (ref 38–126)
Anion gap: 8 (ref 5–15)
BUN: 10 mg/dL (ref 6–20)
CO2: 24 mmol/L (ref 22–32)
Calcium: 9 mg/dL (ref 8.9–10.3)
Chloride: 106 mmol/L (ref 98–111)
Creatinine, Ser: 0.69 mg/dL (ref 0.44–1.00)
GFR, Estimated: >60 mL/min (ref >60)
Glucose, Bld: 96 mg/dL (ref 70–99)
Potassium: 3.7 mmol/L (ref 3.5–5.1)
Sodium: 138 mmol/L (ref 135–145)
Total Bilirubin: 0.8 mg/dL (ref 0.3–1.2)
Total Protein: 7.6 g/dL (ref 6.5–8.1)

## 2021-12-16 LAB — URINALYSIS, ROUTINE W REFLEX MICROSCOPIC
Bilirubin Urine: NEGATIVE
Glucose, UA: NEGATIVE mg/dL
Hgb urine dipstick: NEGATIVE
Ketones, ur: 5 mg/dL — AB
Leukocytes,Ua: NEGATIVE
Nitrite: NEGATIVE
Protein, ur: NEGATIVE mg/dL
Specific Gravity, Urine: 1.01 (ref 1.005–1.030)
pH: 6 (ref 5.0–8.0)

## 2021-12-16 LAB — CBC WITH DIFFERENTIAL/PLATELET
Abs Immature Granulocytes: 0.03 10*3/uL (ref 0.00–0.07)
Basophils Absolute: 0 10*3/uL (ref 0.0–0.1)
Basophils Relative: 0 %
Eosinophils Absolute: 0 10*3/uL (ref 0.0–0.5)
Eosinophils Relative: 1 %
HCT: 34.1 % — ABNORMAL LOW (ref 36.0–46.0)
Hemoglobin: 10.3 g/dL — ABNORMAL LOW (ref 12.0–15.0)
Immature Granulocytes: 0 %
Lymphocytes Relative: 20 %
Lymphs Abs: 1.4 10*3/uL (ref 0.7–4.0)
MCH: 20.8 pg — ABNORMAL LOW (ref 26.0–34.0)
MCHC: 30.2 g/dL (ref 30.0–36.0)
MCV: 68.8 fL — ABNORMAL LOW (ref 80.0–100.0)
Monocytes Absolute: 0.7 10*3/uL (ref 0.1–1.0)
Monocytes Relative: 10 %
Neutro Abs: 4.8 10*3/uL (ref 1.7–7.7)
Neutrophils Relative %: 69 %
Platelets: 173 10*3/uL (ref 150–400)
RBC: 4.96 MIL/uL (ref 3.87–5.11)
RDW: 14.5 % (ref 11.5–15.5)
WBC: 7 10*3/uL (ref 4.0–10.5)
nRBC: 0 % (ref 0.0–0.2)

## 2021-12-16 LAB — LIPASE, BLOOD: Lipase: 21 U/L (ref 11–51)

## 2021-12-16 LAB — POC URINE PREG, ED: Preg Test, Ur: NEGATIVE

## 2021-12-16 MED ORDER — IOHEXOL 300 MG/ML  SOLN
100.0000 mL | Freq: Once | INTRAMUSCULAR | Status: AC | PRN
  Administered 2021-12-16: 100 mL via INTRAVENOUS

## 2021-12-16 MED ORDER — FAMOTIDINE 20 MG PO TABS
20.0000 mg | ORAL_TABLET | Freq: Once | ORAL | Status: AC
  Administered 2021-12-16: 20 mg via ORAL
  Filled 2021-12-16: qty 1

## 2021-12-16 MED ORDER — HYDROCODONE-ACETAMINOPHEN 5-325 MG PO TABS: 1.0000 | ORAL_TABLET | Freq: Four times a day (QID) | ORAL | 0 refills | Status: DC | PRN

## 2021-12-16 MED ORDER — FAMOTIDINE 20 MG PO TABS: 20.0000 mg | ORAL_TABLET | Freq: Two times a day (BID) | ORAL | 0 refills | Status: DC

## 2021-12-16 NOTE — ED Notes (Signed)
Pt verbalized understanding of no driving and to use caution within 4 hours of taking pain meds due to meds cause drowsiness 

## 2021-12-16 NOTE — Discharge Instructions (Signed)
Your lab test and your imaging today are reassuring.  As we discussed, you do have some small fibroids in your uterus which are not the source of today's symptoms.  You also have a 6 mm stone in your left kidney.  This should not be causing any pain and will not until it starts moving down the ureteral tube to your bladder.  It is impossible to know when this may happen, but will cause sudden onset of left flank pain when it does.  This also does not suggest the source of today's pain either.  Your gallbladder is normal, liver and pancreas also normal, there is no sign of any infection in your abdomen.  I do recommend completing the Cipro that you were prescribed yesterday as discussed.  You have also been prescribed medication to help with pain and your occasional acid reflux symptoms. ?

## 2021-12-16 NOTE — ED Triage Notes (Signed)
Patient complaining of right abdominal pain since yesterday. Denies N/V/D.  ?

## 2021-12-20 NOTE — ED Provider Notes (Signed)
?[facility] ?Provider Note ? ? ?CSN: [id number] ?Arrival date & time: 12/16/21  0931 ? ?  ? ?History ? ?Chief Complaint  ?Patient presents with  ? Abdominal Pain  ? ? ?Emma Howard is a 36 y.o. female with no significant past medical or surgical history presenting with a 24 hour history right flank and right lateral abdominal pain which is waxing and waning in character, sharp, worsened with palpation and movement, better at rest.  She denies fevers, chills, n/v/d, dysuria or hematuria, no vaginal dc, no risk for std's, no hx of kidney stones, denies abdominal distention and reports a normal appetite. Also denies cp and sob. She does endorse occasional acid reflux symptoms for which she is not on any specific medicine, this has been increased since yesterday.  Placed on cipro ytd for a uti from UC. ? ?The history is provided by the patient.  ? ?  ? ?Home Medications ?Prior to Admission medications   ?Medication Sig Start Date End Date Taking? Authorizing Provider  ?ciprofloxacin (CIPRO) 500 MG tablet Take 500 mg by mouth 2 (two) times daily. 12/15/21  Yes [provider]  ?famotidine (PEPCID) 20 MG tablet Take 1 tablet (20 mg total) by mouth 2 (two) times daily. 12/16/21  Yes Tlaloc Taddei, Almyra Free, PA-C  ?HYDROcodone-acetaminophen (NORCO/VICODIN) 5-325 MG tablet Take 1 tablet by mouth every 6 (six) hours as needed. 12/16/21  Yes Shahir Karen, Almyra Free, PA-C  ?cyclobenzaprine (FLEXERIL) 10 MG tablet Take 1 tablet (10 mg total) by mouth 2 (two) times daily as needed for muscle spasms. ?Patient not taking: Reported on 12/16/2021 03/06/21   Marcello Fennel, PA-C  ?ibuprofen (ADVIL) 600 MG tablet Take 1 tablet (600 mg total) by mouth every 6 (six) hours as needed. ?Patient not taking: Reported on 03/06/2021 08/12/19   Evalee Jefferson, PA-C  ?[place] 28 0.25-35 MG-MCG tablet TAKE 1 TABLET BY MOUTH ONCE DAILY ?Patient not taking: Reported on 03/06/2021 06/10/18   Roma Schanz, CNM  ?   ? ?Allergies    ?Patient  has no known allergies.   ? ?Review of Systems   ?Review of Systems  ?Constitutional:  Negative for chills and fever.  ?HENT:  Negative for congestion.   ?Eyes: Negative.   ?Respiratory:  Negative for chest tightness and shortness of breath.   ?Cardiovascular:  Negative for chest pain and palpitations.  ?Gastrointestinal:  Positive for abdominal pain. Negative for abdominal distention, constipation, nausea and vomiting.  ?Genitourinary:  Positive for flank pain. Negative for frequency, hematuria and vaginal discharge.  ?Musculoskeletal:  Negative for arthralgias, joint swelling and neck pain.  ?Skin: Negative.  Negative for rash and wound.  ?Neurological:  Negative for dizziness, weakness, light-headedness, numbness and headaches.  ?Psychiatric/Behavioral: Negative.    ? ?Physical Exam ?Updated Vital Signs ?BP 105/68 (BP Location: Right Arm)   Pulse 77   Temp 98.3 ?F (36.8 ?C) (Oral)   Resp 16   Ht 5\' 3"  (1.6 m)   Wt 98 kg   SpO2 100%   BMI 38.26 kg/m?  ?Physical Exam ?Vitals and nursing note reviewed.  ?Constitutional:   ?   Appearance: She is well-developed.  ?HENT:  ?   Head: Normocephalic and atraumatic.  ?Eyes:  ?   Conjunctiva/sclera: Conjunctivae normal.  ?Cardiovascular:  ?   Rate and Rhythm: Normal rate and regular rhythm.  ?   Heart sounds: Normal heart sounds.  ?Pulmonary:  ?   Effort: Pulmonary effort is normal.  ?   Breath sounds: Normal breath sounds.  No wheezing.  ?Abdominal:  ?   General: Bowel sounds are normal.  ?   Palpations: Abdomen is soft.  ?   Tenderness: There is abdominal tenderness in the right upper quadrant. There is no right CVA tenderness, guarding or rebound. Negative signs include Murphy's sign.  ?   Hernia: No hernia is present.  ?Musculoskeletal:     ?   General: Normal range of motion.  ?   Cervical back: Normal range of motion.  ?Skin: ?   General: Skin is warm and dry.  ?Neurological:  ?   General: No focal deficit present.  ?   Mental Status: She is alert.  ? ? ?ED  Results / Procedures / Treatments   ?Labs ?(all labs ordered are listed, but only abnormal results are displayed) ?Labs Reviewed  ?CBC WITH DIFFERENTIAL/PLATELET - Abnormal; Notable for the following components:  ?    Result Value  ? Hemoglobin 10.3 (*)   ? HCT 34.1 (*)   ? MCV 68.8 (*)   ? MCH 20.8 (*)   ? All other components within normal limits  ?URINALYSIS, ROUTINE W REFLEX MICROSCOPIC - Abnormal; Notable for the following components:  ? Ketones, ur 5 (*)   ? All other components within normal limits  ?COMPREHENSIVE METABOLIC PANEL  ?LIPASE, BLOOD  ?POC URINE PREG, ED  ? ? ?EKG ?EKG Interpretation ? ?Date/Time:  Friday December 16 2021 09:40:58 EDT ?Ventricular Rate:  88 ?PR Interval:  139 ?QRS Duration: 92 ?QT Interval:  356 ?QTC Calculation: 431 ?R Axis:   40 ?Text Interpretation: Sinus rhythm Left atrial enlargement Borderline T abnormalities, inferior leads Confirmed by Noemi Chapel [phone]) on 12/16/2021 10:17:58 AM ? ?Radiology ?No results found. ?Results for orders placed or performed during the hospital encounter of 12/16/21  ?CBC with Differential  ?Result Value Ref Range  ? WBC 7.0 4.0 - 10.5 K/uL  ? RBC 4.96 3.87 - 5.11 MIL/uL  ? Hemoglobin 10.3 (L) 12.0 - 15.0 g/dL  ? HCT 34.1 (L) 36.0 - 46.0 %  ? MCV 68.8 (L) 80.0 - 100.0 fL  ? MCH 20.8 (L) 26.0 - 34.0 pg  ? MCHC 30.2 30.0 - 36.0 g/dL  ? RDW 14.5 11.5 - 15.5 %  ? Platelets 173 150 - 400 K/uL  ? nRBC 0.0 0.0 - 0.2 %  ? Neutrophils Relative % 69 %  ? Neutro Abs 4.8 1.7 - 7.7 K/uL  ? Lymphocytes Relative 20 %  ? Lymphs Abs 1.4 0.7 - 4.0 K/uL  ? Monocytes Relative 10 %  ? Monocytes Absolute 0.7 0.1 - 1.0 K/uL  ? Eosinophils Relative 1 %  ? Eosinophils Absolute 0.0 0.0 - 0.5 K/uL  ? Basophils Relative 0 %  ? Basophils Absolute 0.0 0.0 - 0.1 K/uL  ? WBC Morphology MORPHOLOGY UNREMARKABLE   ? RBC Morphology MORPHOLOGY UNREMARKABLE   ? Smear Review MORPHOLOGY UNREMARKABLE   ? Immature Granulocytes 0 %  ? Abs Immature Granulocytes 0.03 0.00 - 0.07 K/uL   ?Comprehensive metabolic panel  ?Result Value Ref Range  ? Sodium 138 135 - 145 mmol/L  ? Potassium 3.7 3.5 - 5.1 mmol/L  ? Chloride 106 98 - 111 mmol/L  ? CO2 24 22 - 32 mmol/L  ? Glucose, Bld 96 70 - 99 mg/dL  ? BUN 10 6 - 20 mg/dL  ? Creatinine, Ser 0.69 0.44 - 1.00 mg/dL  ? Calcium 9.0 8.9 - 10.3 mg/dL  ? Total Protein 7.6 6.5 - 8.1 g/dL  ? Albumin 4.0  3.5 - 5.0 g/dL  ? AST 17 15 - 41 U/L  ? ALT 14 0 - 44 U/L  ? Alkaline Phosphatase 64 38 - 126 U/L  ? Total Bilirubin 0.8 0.3 - 1.2 mg/dL  ? GFR, Estimated >60 >60 mL/min  ? Anion gap 8 5 - 15  ?Lipase, blood  ?Result Value Ref Range  ? Lipase 21 11 - 51 U/L  ?Urinalysis, Routine w reflex microscopic Urine, Clean Catch  ?Result Value Ref Range  ? Color, Urine YELLOW YELLOW  ? APPearance CLEAR CLEAR  ? Specific Gravity, Urine 1.010 1.005 - 1.030  ? pH 6.0 5.0 - 8.0  ? Glucose, UA NEGATIVE NEGATIVE mg/dL  ? Hgb urine dipstick NEGATIVE NEGATIVE  ? Bilirubin Urine NEGATIVE NEGATIVE  ? Ketones, ur 5 (A) NEGATIVE mg/dL  ? Protein, ur NEGATIVE NEGATIVE mg/dL  ? Nitrite NEGATIVE NEGATIVE  ? Leukocytes,Ua NEGATIVE NEGATIVE  ?POC urine preg, ED  ?Result Value Ref Range  ? Preg Test, Ur NEGATIVE NEGATIVE  ? ?CT ABDOMEN PELVIS W CONTRAST ? ?Result Date: 12/16/2021 ?CLINICAL DATA:  Right lower quadrant pain for the past 2 days. EXAM: CT ABDOMEN AND PELVIS WITH CONTRAST TECHNIQUE: Multidetector CT imaging of the abdomen and pelvis was performed using the standard protocol following bolus administration of intravenous contrast. RADIATION DOSE REDUCTION: This exam was performed according to the departmental dose-optimization program which includes automated exposure control, adjustment of the mA and/or kV according to patient size and/or use of iterative reconstruction technique. CONTRAST:  157mL OMNIPAQUE IOHEXOL 300 MG/ML  SOLN COMPARISON:  CT abdomen pelvis report dated December 21, 2015. FINDINGS: Lower chest: Mild subsegmental atelectasis at both lung bases. Hepatobiliary: No  focal liver abnormality is seen. No gallstones, gallbladder wall thickening, or biliary dilatation. Pancreas: Unremarkable. No pancreatic ductal dilatation or surrounding inflammatory changes. Spleen: Normal in size without

## 2021-12-21 LAB — HM PAP SMEAR: HM Pap smear: NEGATIVE

## 2022-04-07 ENCOUNTER — Ambulatory Visit
Admission: EM | Admit: 2022-04-07 | Discharge: 2022-04-07 | Disposition: A | Discharge status: Home / Self Care | Payer: Medicaid Other

## 2022-04-07 DIAGNOSIS — H6121 Impacted cerumen, right ear: Secondary | ICD-10-CM | POA: Diagnosis not present

## 2022-04-07 DIAGNOSIS — H6592 Unspecified nonsuppurative otitis media, left ear: Secondary | ICD-10-CM | POA: Diagnosis not present

## 2022-04-07 DIAGNOSIS — H9201 Otalgia, right ear: Secondary | ICD-10-CM

## 2022-04-07 MED ORDER — AMOXICILLIN-POT CLAVULANATE 875-125 MG PO TABS: 1.0000 | ORAL_TABLET | Freq: Two times a day (BID) | ORAL | 0 refills | Status: AC

## 2022-04-07 MED ORDER — CIPROFLOXACIN-DEXAMETHASONE 0.3-0.1 % OT SUSP: 4.0000 [drp] | Freq: Two times a day (BID) | OTIC | 0 refills | Status: DC

## 2022-04-07 NOTE — Discharge Instructions (Signed)
To treat the inner ear infection in your left ear, please begin Augmentin (amoxicillin/clavulanate) as soon as you pick it up, please take 1 tablet twice daily for the next 10 days.    To treat the outer ear infection in your right ear, please pick up and begin using Ciprodex drops by instilling 4 drops into your right ear twice daily for a full 7-day treatment.  Please follow-up in the next 7 to 10 days if you do not have complete relief of your symptoms.  You are also welcome to return in the next 3 to 4 days after using Ciprodex if you would like for [place] to again attempt to clean out the wax out of your right ear.  Thank you for visiting urgent care today.

## 2022-04-07 NOTE — ED Triage Notes (Signed)
The pt c/o right ear pain that began 4 days ago.   Home interventions: ibuprofen 800mg 

## 2022-04-07 NOTE — ED Provider Notes (Signed)
[facility]    CSN: [id number] Arrival date & time: 04/07/22  1409    HISTORY   Chief Complaint  Patient presents with   Otalgia   HPI Emma Howard is a pleasant, 36 y.o. female who presents to urgent care today. Patient complains of pain in her right ear that began 4 days ago.  Patient states that she went swimming a little over a week ago and believes she may have gotten water in her ear.  Patient states has been taking ibuprofen 800 mg with no relief of her pain.  Patient states the pain has become progressively worse over the past 4 days.  Patient denies any drainage from her ear or hearing loss.  Patient states left ear has been feeling full but it has not been painful.  Patient reports pain on the right side of her jaw with chewing and swallowing.  The history is provided by the patient.   History reviewed. No pertinent past medical history. There are no problems to display for this patient.  Past Surgical History:  Procedure Laterality Date   none     OB History     Gravida  7   Para  4   Term      Preterm      AB  3   Living         SAB      IAB  3   Ectopic      Multiple      Live Births             Home Medications    Prior to Admission medications   Medication Sig Start Date End Date Taking? Authorizing Provider  famotidine (PEPCID) 20 MG tablet Take 1 tablet (20 mg total) by mouth 2 (two) times daily. 12/16/21   Burgess Amor, PA-C  Vitamin D, Ergocalciferol, (DRISDOL) 1.25 MG (50000 UNIT) CAPS capsule Take 50,000 Units by mouth once a week. 03/27/22   [provider]    Family History Family History  Problem Relation Age of Onset   Schizophrenia Mother    Diabetes Son    Asthma Son    Asthma Son    Social History Social History   Tobacco Use   Smoking status: Former   Smokeless tobacco: Never  Substance Use Topics   Alcohol use: No   Drug use: No   Allergies   Patient has no known allergies.  Review  of Systems Review of Systems Pertinent findings revealed after performing a 14 point review of systems has been noted in the history of present illness.  Physical Exam Triage Vital Signs ED Triage Vitals  Enc Vitals Group     BP 06/24/21 0827 (!) 147/82     Pulse Rate 06/24/21 0827 72     Resp 06/24/21 0827 18     Temp 06/24/21 0827 98.3 F (36.8 C)     Temp Source 06/24/21 0827 Oral     SpO2 06/24/21 0827 98 %     Weight --      Height --      Head Circumference --      Peak Flow --      Pain Score 06/24/21 0826 5     Pain Loc --      Pain Edu? --      Excl. in GC? --   No data found.  Updated Vital Signs BP 113/75 (BP Location: Right Arm)   Pulse 74   Temp  98.4 F (36.9 C) (Oral)   Resp 18   LMP 04/03/2022   SpO2 97%   Physical Exam Vitals and nursing note reviewed.  Constitutional:      General: She is not in acute distress.    Appearance: Normal appearance. She is not ill-appearing.  HENT:     Head: Normocephalic and atraumatic.     Salivary Glands: Right salivary gland is not diffusely enlarged or tender. Left salivary gland is not diffusely enlarged or tender.     Right Ear: Hearing and external ear normal. There is impacted cerumen.     Left Ear: Hearing and external ear normal. No drainage.  No middle ear effusion. There is no impacted cerumen. Tympanic membrane is injected, erythematous and bulging.     Ears:     Comments: Left EAC erythematous and edematous    Nose: Nose normal. No nasal deformity, septal deviation, mucosal edema, congestion or rhinorrhea.     Right Turbinates: Not enlarged, swollen or pale.     Left Turbinates: Not enlarged, swollen or pale.     Right Sinus: No maxillary sinus tenderness or frontal sinus tenderness.     Left Sinus: No maxillary sinus tenderness or frontal sinus tenderness.     Mouth/Throat:     Lips: Pink. No lesions.     Mouth: Mucous membranes are moist. No oral lesions.     Pharynx: Oropharynx is clear. Uvula  midline. No posterior oropharyngeal erythema or uvula swelling.     Tonsils: No tonsillar exudate. 0 on the right. 0 on the left.  Eyes:     General: Lids are normal.        Right eye: No discharge.        Left eye: No discharge.     Extraocular Movements: Extraocular movements intact.     Conjunctiva/sclera: Conjunctivae normal.     Right eye: Right conjunctiva is not injected.     Left eye: Left conjunctiva is not injected.  Neck:     Trachea: Trachea and phonation normal.  Cardiovascular:     Rate and Rhythm: Normal rate and regular rhythm.     Pulses: Normal pulses.     Heart sounds: Normal heart sounds. No murmur heard.    No friction rub. No gallop.  Pulmonary:     Effort: Pulmonary effort is normal. No accessory muscle usage, prolonged expiration or respiratory distress.     Breath sounds: Normal breath sounds. No stridor, decreased air movement or transmitted upper airway sounds. No decreased breath sounds, wheezing, rhonchi or rales.  Chest:     Chest wall: No tenderness.  Musculoskeletal:        General: Normal range of motion.     Cervical back: Normal range of motion and neck supple. Normal range of motion.  Lymphadenopathy:     Cervical: No cervical adenopathy.  Skin:    General: Skin is warm and dry.     Findings: No erythema or rash.  Neurological:     General: No focal deficit present.     Mental Status: She is alert and oriented to person, place, and time.  Psychiatric:        Mood and Affect: Mood normal.        Behavior: Behavior normal.     Visual Acuity Right Eye Distance:   Left Eye Distance:   Bilateral Distance:    Right Eye Near:   Left Eye Near:    Bilateral Near:     [facility] /  Diagnostics / Procedures:     Radiology No results found.  Procedures Ear Cerumen Removal  Date/Time: 04/07/2022 3:10 PM  Performed by: Rexene Edison, RN Authorized by: Theadora Rama Scales, PA-C   Consent:    Consent obtained:  Verbal   Consent  given by:  Patient   Risks, benefits, and alternatives were discussed: yes     Risks discussed:  Bleeding, dizziness, incomplete removal, infection, pain and TM perforation   Alternatives discussed:  No treatment Universal protocol:    Procedure explained and questions answered to patient or proxy's satisfaction: yes     Patient identity confirmed:  Verbally with patient and arm band Procedure details:    Location:  R ear   Procedure type: irrigation     Procedure outcomes: unable to remove cerumen   Post-procedure details:    Procedure completion:  Procedure terminated at patient's request  (including critical care time) EKG  Pending results:  Labs Reviewed - No data to display  Medications Ordered in UC: Medications - No data to display  UC Diagnoses / Final Clinical Impressions(s)   I have reviewed the triage vital signs and the nursing notes.  Pertinent labs & imaging results that were available during my care of the patient were reviewed by me and considered in my medical decision making (see chart for details).    Final diagnoses:  Impacted cerumen of right ear  Acute otalgia, right  Left otitis media with effusion   Patient with likely swimmer's ear and right external ear and infection in left inner ear.  Patient provided with Ciprodex eardrops and prescription for Augmentin to treat both.  Return precautions advised.  ED Prescriptions     Medication Sig Dispense Auth. Provider   amoxicillin-clavulanate (AUGMENTIN) 875-125 MG tablet Take 1 tablet by mouth 2 (two) times daily for 10 days. 20 tablet Theadora Rama Scales, PA-C   ciprofloxacin-dexamethasone (CIPRODEX) OTIC suspension Place 4 drops into the right ear 2 (two) times daily. X 7 days 7.5 mL Theadora Rama Scales, PA-C      PDMP not reviewed this encounter.  Disposition Upon Discharge:  Condition: stable for discharge home Home: take medications as prescribed; routine discharge instructions as discussed;  follow up as advised.  Patient presented with an acute illness with associated systemic symptoms and significant discomfort requiring urgent management. In my opinion, this is a condition that a prudent lay person (someone who possesses an average knowledge of health and medicine) may potentially expect to result in complications if not addressed urgently such as respiratory distress, impairment of bodily function or dysfunction of bodily organs.   Routine symptom specific, illness specific and/or disease specific instructions were discussed with the patient and/or caregiver at length.   As such, the patient has been evaluated and assessed, work-up was performed and treatment was provided in alignment with urgent care protocols and evidence based medicine.  Patient/parent/caregiver has been advised that the patient may require follow up for further testing and treatment if the symptoms continue in spite of treatment, as clinically indicated and appropriate.  If the patient was tested for COVID-19, Influenza and/or RSV, then the patient/parent/guardian was advised to isolate at home pending the results of his/her diagnostic coronavirus test and potentially longer if they're positive. I have also advised pt that if his/her COVID-19 test returns positive, it's recommended to self-isolate for at least 10 days after symptoms first appeared AND until fever-free for 24 hours without fever reducer AND other symptoms have improved or resolved.  Discussed self-isolation recommendations as well as instructions for household member/close contacts as per the [facility] and [place] DHHS, and also gave patient the COVID packet with this information.  Patient/parent/caregiver has been advised to return to the [facility] or PCP in 3-5 days if no better; to PCP or the Emergency Department if new signs and symptoms develop, or if the current signs or symptoms continue to change or worsen for further workup, evaluation and treatment as  clinically indicated and appropriate  The patient will follow up with their current PCP if and as advised. If the patient does not currently have a PCP we will assist them in obtaining one.   The patient may need specialty follow up if the symptoms continue, in spite of conservative treatment and management, for further workup, evaluation, consultation and treatment as clinically indicated and appropriate.  Patient/parent/caregiver verbalized understanding and agreement of plan as discussed.  All questions were addressed during visit.  Please see discharge instructions below for further details of plan.  Discharge Instructions:   Discharge Instructions      To treat the inner ear infection in your left ear, please begin Augmentin (amoxicillin/clavulanate) as soon as you pick it up, please take 1 tablet twice daily for the next 10 days.    To treat the outer ear infection in your right ear, please pick up and begin using Ciprodex drops by instilling 4 drops into your right ear twice daily for a full 7-day treatment.  Please follow-up in the next 7 to 10 days if you do not have complete relief of your symptoms.  You are also welcome to return in the next 3 to 4 days after using Ciprodex if you would like for [place] to again attempt to clean out the wax out of your right ear.  Thank you for visiting urgent care today.      This office note has been dictated using Teaching laboratory technician.  Unfortunately, this method of dictation can sometimes lead to typographical or grammatical errors.  I apologize for your inconvenience in advance if this occurs.  Please do not hesitate to reach out to me if clarification is needed.      Theadora Rama Scales, PA-C 04/07/22 1515

## 2022-08-23 ENCOUNTER — Emergency Department (HOSPITAL_COMMUNITY)
Admission: EM | Admit: 2022-08-23 | Discharge: 2022-08-24 | Disposition: A | Discharge status: Home / Self Care | Payer: Medicaid Other | Attending: Emergency Medicine | Admitting: Emergency Medicine

## 2022-08-23 ENCOUNTER — Emergency Department (HOSPITAL_COMMUNITY): Payer: Medicaid Other

## 2022-08-23 ENCOUNTER — Other Ambulatory Visit: Payer: Self-pay

## 2022-08-23 ENCOUNTER — Encounter (HOSPITAL_COMMUNITY): Payer: Self-pay

## 2022-08-23 DIAGNOSIS — N201 Calculus of ureter: Secondary | ICD-10-CM

## 2022-08-23 DIAGNOSIS — N132 Hydronephrosis with renal and ureteral calculous obstruction: Secondary | ICD-10-CM | POA: Diagnosis not present

## 2022-08-23 DIAGNOSIS — M549 Dorsalgia, unspecified: Secondary | ICD-10-CM | POA: Diagnosis present

## 2022-08-23 LAB — URINALYSIS, ROUTINE W REFLEX MICROSCOPIC
Bilirubin Urine: NEGATIVE
Glucose, UA: NEGATIVE mg/dL
Ketones, ur: NEGATIVE mg/dL
Nitrite: NEGATIVE
Protein, ur: 30 mg/dL — AB
RBC / HPF: >50 RBC/hpf — ABNORMAL HIGH (ref 0–5)
Specific Gravity, Urine: 1.017 (ref 1.005–1.030)
pH: 5 (ref 5.0–8.0)

## 2022-08-23 LAB — CBC WITH DIFFERENTIAL/PLATELET
Abs Immature Granulocytes: 0.05 10*3/uL (ref 0.00–0.07)
Basophils Absolute: 0 10*3/uL (ref 0.0–0.1)
Basophils Relative: 0 %
Eosinophils Absolute: 0.1 10*3/uL (ref 0.0–0.5)
Eosinophils Relative: 1 %
HCT: 40.4 % (ref 36.0–46.0)
Hemoglobin: 12.7 g/dL (ref 12.0–15.0)
Immature Granulocytes: 1 %
Lymphocytes Relative: 16 %
Lymphs Abs: 1.5 10*3/uL (ref 0.7–4.0)
MCH: 22.8 pg — ABNORMAL LOW (ref 26.0–34.0)
MCHC: 31.4 g/dL (ref 30.0–36.0)
MCV: 72.5 fL — ABNORMAL LOW (ref 80.0–100.0)
Monocytes Absolute: 0.4 10*3/uL (ref 0.1–1.0)
Monocytes Relative: 4 %
Neutro Abs: 7.2 10*3/uL (ref 1.7–7.7)
Neutrophils Relative %: 78 %
Platelets: 211 10*3/uL (ref 150–400)
RBC: 5.57 MIL/uL — ABNORMAL HIGH (ref 3.87–5.11)
RDW: 15.9 % — ABNORMAL HIGH (ref 11.5–15.5)
WBC: 9.2 10*3/uL (ref 4.0–10.5)
nRBC: 0 % (ref 0.0–0.2)

## 2022-08-23 LAB — COMPREHENSIVE METABOLIC PANEL WITH GFR
ALT: 16 U/L (ref 0–44)
AST: 23 U/L (ref 15–41)
Albumin: 4.4 g/dL (ref 3.5–5.0)
Alkaline Phosphatase: 63 U/L (ref 38–126)
Anion gap: 8 (ref 5–15)
BUN: 12 mg/dL (ref 6–20)
CO2: 23 mmol/L (ref 22–32)
Calcium: 9.2 mg/dL (ref 8.9–10.3)
Chloride: 109 mmol/L (ref 98–111)
Creatinine, Ser: 0.82 mg/dL (ref 0.44–1.00)
GFR, Estimated: >60 mL/min
Glucose, Bld: 107 mg/dL — ABNORMAL HIGH (ref 70–99)
Potassium: 3.8 mmol/L (ref 3.5–5.1)
Sodium: 140 mmol/L (ref 135–145)
Total Bilirubin: 0.7 mg/dL (ref 0.3–1.2)
Total Protein: 8.2 g/dL — ABNORMAL HIGH (ref 6.5–8.1)

## 2022-08-23 LAB — I-STAT BETA HCG BLOOD, ED (MC, WL, AP ONLY): I-stat hCG, quantitative: <5 m[IU]/mL

## 2022-08-23 LAB — LIPASE, BLOOD: Lipase: 31 U/L (ref 11–51)

## 2022-08-23 MED ORDER — IOHEXOL 300 MG/ML  SOLN
100.0000 mL | Freq: Once | INTRAMUSCULAR | Status: AC | PRN
Start: 2022-08-23 — End: 2022-08-23
  Administered 2022-08-23: 100 mL via INTRAVENOUS

## 2022-08-23 NOTE — ED Provider Triage Note (Signed)
Emergency Medicine Provider Triage Evaluation Note  Emma Howard , a 36 y.o. female  was evaluated in triage.  Pt complains of domino pain, nausea, vomiting.  Denies fever or other complaints.  Has been going on for the past couple days.   Review of Systems  Positive: As above Negative: As above  Physical Exam  BP (!) 148/102 (BP Location: Left Arm)   Pulse 90   Temp 97.6 F (36.4 C) (Oral)   Resp 18   Ht 5\' 3"  (1.6 m)   Wt 98 kg   SpO2 99%   BMI 38.27 kg/m  Gen:   Awake, no distress   Resp:  Normal effort  MSK:   Moves extremities without difficulty  Other:    Medical Decision Making  Medically screening exam initiated at 8:21 PM.  Appropriate orders placed.  Emma Howard was informed that the remainder of the evaluation will be completed by another provider, this initial triage assessment does not replace that evaluation, and the importance of remaining in the ED until their evaluation is complete.     Rosalie Gums, PA-C 08/23/22 2021

## 2022-08-23 NOTE — ED Notes (Signed)
Patient needs IV and labs for CT

## 2022-08-23 NOTE — ED Triage Notes (Signed)
Pt BIB EMS with reports of nausea, vomiting, and abdominal pain since Christmas.

## 2022-08-24 MED ORDER — TAMSULOSIN HCL 0.4 MG PO CAPS: 0.4000 mg | ORAL_CAPSULE | Freq: Every day | ORAL | 0 refills | Status: DC

## 2022-08-24 MED ORDER — CEPHALEXIN 500 MG PO CAPS: 500.0000 mg | ORAL_CAPSULE | Freq: Two times a day (BID) | ORAL | 0 refills | Status: AC

## 2022-08-24 MED ORDER — HYDROCODONE-ACETAMINOPHEN 5-325 MG PO TABS: 2.0000 | ORAL_TABLET | ORAL | 0 refills | Status: DC | PRN

## 2022-08-24 NOTE — Discharge Instructions (Signed)
You have a kidney stone that has made its way down toward your bladder. This is likely the cause of your pain and your nausea. You have been prescribed pain medication and a medication to assist you in passing the stone. Please follow up with the urologist listed below and return to the ER with any new severe symptoms.

## 2022-08-24 NOTE — ED Provider Notes (Signed)
[facility] DEPT Provider Note   CSN: [id number] Arrival date & time: 08/23/22  2005     History  Chief Complaint  Patient presents with   Abdominal Pain   Emesis   Nausea    Emma Howard is a 36 y.o. female who presents with concern for severe abdominal pain that starts in the left back and radiates down the left side with associated nausea and vomiting when the pain is most severe.  No fevers chills urinary frequency urgency, dysuria, or hematuria.  Patient with known kidney stones in the past but had never passed any of them.  Denies known sick contacts.  I personally reviewed her medical records.  Does not carry medical diagnoses nonmedication's daily. HPI     Home Medications Prior to Admission medications   Medication Sig Start Date End Date Taking? Authorizing Provider  HYDROcodone-acetaminophen (NORCO/VICODIN) 5-325 MG tablet Take 2 tablets by mouth every 4 (four) hours as needed. 08/24/22  Yes Keylah Darwish, Gypsy Balsam, PA-C  tamsulosin (FLOMAX) 0.4 MG CAPS capsule Take 1 capsule (0.4 mg total) by mouth daily. 08/24/22  Yes Zaeda Mcferran, Gypsy Balsam, PA-C  ciprofloxacin-dexamethasone (CIPRODEX) OTIC suspension Place 4 drops into the right ear 2 (two) times daily. X 7 days 04/07/22   Lynden Oxford Scales, PA-C  famotidine (PEPCID) 20 MG tablet Take 1 tablet (20 mg total) by mouth 2 (two) times daily. 12/16/21   Evalee Jefferson, PA-C  Vitamin D, Ergocalciferol, (DRISDOL) 1.25 MG (50000 UNIT) CAPS capsule Take 50,000 Units by mouth once a week. 03/27/22   [provider]      Allergies    Patient has no known allergies.    Review of Systems   Review of Systems  Constitutional: Negative.   HENT: Negative.    Respiratory: Negative.    Cardiovascular: Negative.   Gastrointestinal:  Positive for abdominal pain, nausea and vomiting. Negative for anal bleeding, blood in stool, constipation and diarrhea.  Genitourinary:  Negative for decreased  urine volume, dysuria, frequency, hematuria, menstrual problem, pelvic pain, urgency, vaginal bleeding, vaginal discharge and vaginal pain.  Neurological: Negative.     Physical Exam Updated Vital Signs BP 129/88   Pulse 80   Temp 98.4 F (36.9 C) (Oral)   Resp 16   Ht 5\' 3"  (1.6 m)   Wt 98 kg   SpO2 97%   BMI 38.27 kg/m  Physical Exam Vitals and nursing note reviewed.  Constitutional:      Appearance: She is not ill-appearing or toxic-appearing.  HENT:     Head: Normocephalic and atraumatic.     Mouth/Throat:     Mouth: Mucous membranes are moist.     Pharynx: No oropharyngeal exudate or posterior oropharyngeal erythema.  Eyes:     General:        Right eye: No discharge.        Left eye: No discharge.     Extraocular Movements: Extraocular movements intact.     Conjunctiva/sclera: Conjunctivae normal.     Pupils: Pupils are equal, round, and reactive to light.  Cardiovascular:     Rate and Rhythm: Normal rate and regular rhythm.     Pulses: Normal pulses.     Heart sounds: Normal heart sounds. No murmur heard. Pulmonary:     Effort: Pulmonary effort is normal. No respiratory distress.     Breath sounds: Normal breath sounds. No wheezing or rales.  Abdominal:     General: Bowel sounds are normal. There is no distension.  Palpations: Abdomen is soft.     Tenderness: There is no abdominal tenderness. There is no right CVA tenderness, left CVA tenderness, guarding or rebound.  Musculoskeletal:        General: No deformity.     Cervical back: Neck supple.  Skin:    General: Skin is warm and dry.     Capillary Refill: Capillary refill takes less than 2 seconds.  Neurological:     General: No focal deficit present.     Mental Status: She is alert and oriented to person, place, and time. Mental status is at baseline.  Psychiatric:        Mood and Affect: Mood normal.     ED Results / Procedures / Treatments   Labs (all labs ordered are listed, but only abnormal  results are displayed) Labs Reviewed  CBC WITH DIFFERENTIAL/PLATELET - Abnormal; Notable for the following components:      Result Value   RBC 5.57 (*)    MCV 72.5 (*)    MCH 22.8 (*)    RDW 15.9 (*)    All other components within normal limits  COMPREHENSIVE METABOLIC PANEL - Abnormal; Notable for the following components:   Glucose, Bld 107 (*)    Total Protein 8.2 (*)    All other components within normal limits  URINALYSIS, ROUTINE W REFLEX MICROSCOPIC - Abnormal; Notable for the following components:   APPearance HAZY (*)    Hgb urine dipstick LARGE (*)    Protein, ur 30 (*)    Leukocytes,Ua MODERATE (*)    RBC / HPF >50 (*)    Bacteria, UA RARE (*)    All other components within normal limits  URINE CULTURE  LIPASE, BLOOD  I-STAT BETA HCG BLOOD, ED ([facility], [facility], [facility] ONLY)    EKG None  Radiology CT ABDOMEN PELVIS W CONTRAST  Result Date: 08/23/2022 CLINICAL DATA:  Abdominal pain, acute. Nausea vomiting and abdominal pain since Christmas. EXAM: CT ABDOMEN AND PELVIS WITH CONTRAST TECHNIQUE: Multidetector CT imaging of the abdomen and pelvis was performed using the standard protocol following bolus administration of intravenous contrast. RADIATION DOSE REDUCTION: This exam was performed according to the departmental dose-optimization program which includes automated exposure control, adjustment of the mA and/or kV according to patient size and/or use of iterative reconstruction technique. CONTRAST:  169mL OMNIPAQUE IOHEXOL 300 MG/ML  SOLN COMPARISON:  CT examination dated December 16, 2021 FINDINGS: Lower chest: No acute abnormality. Hepatobiliary: No focal liver abnormality is seen. No gallstones, gallbladder wall thickening, or biliary dilatation. Pancreas: Unremarkable. No pancreatic ductal dilatation or surrounding inflammatory changes. Spleen: Normal in size without focal abnormality. Adrenals/Urinary Tract: Adrenal glands are unremarkable. Moderate left hydroureteronephrosis  secondary to a 5 x 8 mm calculus in the left distal ureter. 5 mm nonobstructing calculus in the lower pole of the left kidney. Right kidney and ureter are unremarkable. Urinary bladder is normal. Stomach/Bowel: Stomach is within normal limits. Appendix appears normal. No evidence of bowel wall thickening, distention, or inflammatory changes. Vascular/Lymphatic: No significant vascular findings are present. No enlarged abdominal or pelvic lymph nodes. Reproductive: Uterus and bilateral adnexa are unremarkable. Other: No abdominal wall hernia or abnormality. No abdominopelvic ascites. 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 IMPRESSION: 1. Moderate left hydroureteronephrosis secondary to a 5 x 8 mm calculus in the left distal ureter. 2. 5 mm nonobstructing calculus in the lower pole of the left kidney. Right kidney and ureter are unremarkable. 3. Mild multilevel degenerate  disc disease prominent at L5-S1 with vacuum disc phenomena. Electronically Signed   By: Larose Hires D.O.   On: 08/23/2022 23:25    Procedures Procedures    Medications Ordered in ED Medications  iohexol (OMNIPAQUE) 300 MG/ML solution 100 mL (100 mLs Intravenous Contrast Given 08/23/22 2314)    ED Course/ Medical Decision Making/ A&P                           Medical Decision Making 36 year old female presents with nausea vomiting and abdominal pain that radiates from the flank to the left lower abdomen.  Hypertensive on intake vitals otherwise normal.  Cardiopulmonary exam is normal, abdominal exam is benign.  Neurovascular intact in all extremities.  The differential diagnosis of emergent flank pain includes, but is not limited to: Nephrolithiasis/ Renal Colic, Pyelonephritis, Abdominal aortic aneurysm, Aortic dissection, Renal artery embolism, Renal vein thrombosis, Renal infarction, Renal hemorrhage, Mesenteric ischemia, Bladder tumor, Cystitis, Biliary colic,  Pancreatitis, Perforated peptic ulcer,  Appendicitis, Inguinal Hernia, Diverticulitis, Bowel obstruction. Shingles, Lower lobe pneumonia, Retroperitoneal hematoma/abscess/tumor, Epidural abscess, Epidural hematoma.  Particularly in females it is important to consider (Ectopic Pregnancy,PID/TOA,Ovarian cyst, Ovarian torsion, STD)     Amount and/or Complexity of Data Reviewed Labs: ordered.    Details: CBC without leukocytosis or anemia, CMP unremarkable, lipase normal.  Urine with large hemoglobin, moderate proteins, 21-50 WBCs but rare bacteria.  Given patient is asymptomatic from a urinary standpoint and has been afebrile not convinced that this urine is infectious.  Urine culture pending.   Radiology:     Details:  CT abdomen pelvis with moderate left hydroureteronephrosis secondary to 5 x 8 mm calculus in the left distal ureter.  Nonobstructing calculus in the lower pole of the left kidney.  Risk Prescription drug management.    Clinical patient was consistent with acute ureterolithiasis.  Nausea and vomiting secondary to pain related to the stone.  Patient states that she is never nauseous or vomiting when she does not have severe pain.  No further workup warranted in the ED at this time given reassuring physical exam and vital signs.  Will discharge with Flomax, analgesia, prophylactic antibiotics.  No further comport in ED at this time.  Clinical concern for emergent underlying etiology that warrant further ED workup or inpatient management is exceedingly low.  Acasia voiced understanding of her medical evaluation and treatment plan. Each of their questions answered to their expressed satisfaction.  Return precautions were given.  Patient is well-appearing, stable, and was discharged in good condition.  This chart was dictated using voice recognition software, Dragon. Despite the best efforts of this provider to proofread and correct errors, errors may still occur which can change  documentation meaning.   Final Clinical Impression(s) / ED Diagnoses Final diagnoses:  Ureterolithiasis    Rx / DC Orders ED Discharge Orders          Ordered    tamsulosin (FLOMAX) 0.4 MG CAPS capsule  Daily        08/24/22 0417    HYDROcodone-acetaminophen (NORCO/VICODIN) 5-325 MG tablet  Every 4 hours PRN        08/24/22 0417              Tiajuana Leppanen, Eugene Gavia, PA-C 08/24/22 0533    Molpus, Jonny Ruiz, MD 08/24/22 [phone]

## 2022-08-25 LAB — URINE CULTURE

## 2022-11-21 ENCOUNTER — Encounter: Payer: Self-pay | Admitting: Urology

## 2022-11-21 ENCOUNTER — Ambulatory Visit (INDEPENDENT_AMBULATORY_CARE_PROVIDER_SITE_OTHER): Payer: Medicaid Other | Admitting: Urology

## 2022-11-21 VITALS — BP 118/81 | HR 78 | Ht 64.0 in | Wt 213.0 lb

## 2022-11-21 DIAGNOSIS — Z87442 Personal history of urinary calculi: Secondary | ICD-10-CM | POA: Diagnosis not present

## 2022-11-21 DIAGNOSIS — Z466 Encounter for fitting and adjustment of urinary device: Secondary | ICD-10-CM

## 2022-11-21 DIAGNOSIS — N200 Calculus of kidney: Secondary | ICD-10-CM

## 2022-11-21 LAB — MICROSCOPIC EXAMINATION
Cast Type: NONE SEEN
Casts: NONE SEEN /lpf
Mucus, UA: NONE SEEN
RBC, Urine: >30 /hpf — AB (ref 0–2)
Trichomonas, UA: NONE SEEN
WBC, UA: >30 /hpf — AB (ref 0–5)
Yeast, UA: NONE SEEN

## 2022-11-21 LAB — URINALYSIS, ROUTINE W REFLEX MICROSCOPIC
Bilirubin, UA: NEGATIVE
Glucose, UA: NEGATIVE
Ketones, UA: NEGATIVE
Nitrite, UA: NEGATIVE
Specific Gravity, UA: 1.025 (ref 1.005–1.030)
Urobilinogen, Ur: 0.2 mg/dL (ref 0.2–1.0)
pH, UA: 6.5 (ref 5.0–7.5)

## 2022-11-21 MED ORDER — SULFAMETHOXAZOLE-TRIMETHOPRIM 800-160 MG PO TABS
5.0000 | ORAL_TABLET | Freq: Two times a day (BID) | ORAL | Status: AC
  Administered 2022-11-21: 5 via ORAL

## 2022-11-21 NOTE — Progress Notes (Signed)
   Assessment: 1. Nephrolithiasis      Plan: Double-J stent removed today without difficulty. Patient was provided Bactrim DS No. 5 post procedure to take twice daily  I subsequently had a long discussion with the patient regarding stone prevention measures including proper fluid intake, sodium restriction, moderation of animal protein and oxalate. Patient will return in 6 weeks for recheck with renal ultrasound prior to visit.  Chief Complaint: Nephrolithiasis  History of Present Illness:  Emma Howard is a 37 y.o. female who is seen today as a new patient regarding nephrolithiasis.  The patient underwent ureteral stent placement on 10/21/2022 for a 7 mm left mid ureteral stone.  Patient was subsequently taken back to the operating room on 11/13/2022 and underwent laser lithotripsy with stone extraction.  This procedure was performed in [place].  The plan was for patient to return in 1 week for stent removal.   The urology providers there are not within her network and she lives much closer to here and elected to transfer her care here for stent removal and ongoing urologic follow-up.     Past Medical History:  No past medical history on file.  Past Surgical History:  Past Surgical History:  Procedure Laterality Date   none      Allergies:  No Known Allergies  Family History:  Family History  Problem Relation Age of Onset   Schizophrenia Mother    Diabetes Son    Asthma Son    Asthma Son     Social History:  Social History   Tobacco Use   Smoking status: Former   Smokeless tobacco: Never  Substance Use Topics   Alcohol use: Yes   Drug use: No    Review of symptoms:  Constitutional:  Negative for unexplained weight loss, night sweats, fever, chills ENT:  Negative for nose bleeds, sinus pain, painful swallowing CV:  Negative for chest pain, shortness of breath, exercise intolerance, palpitations, loss of consciousness Resp:  Negative for cough, wheezing,  shortness of breath GI:  Negative for nausea, vomiting, diarrhea, bloody stools GU:  Positives noted in HPI; otherwise negative for gross hematuria, dysuria, urinary incontinence Neuro:  Negative for seizures, poor balance, limb weakness, slurred speech Psych:  Negative for lack of energy, depression, anxiety Endocrine:  Negative for polydipsia, polyuria, symptoms of hypoglycemia (dizziness, hunger, sweating) Hematologic:  Negative for anemia, purpura, petechia, prolonged or excessive bleeding, use of anticoagulants  Allergic:  Negative for difficulty breathing or choking as a result of exposure to anything; no shellfish allergy; no allergic response (rash/itch) to materials, foods  Physical exam: There were no vitals taken for this visit. GENERAL APPEARANCE: Obese black female, NAD    Procedure: Cystoscopy with removal of left ureteral stent  Indications: Nephrolithiasis with ureteral stent  Description of procedure: The patient was correctly identified by providing her full name and date of birth and provided written informed consent.  Preprocedural timeout was performed. The patient was subsequently positioned in modified dorsolithotomy position and prepped in the usual fashion.  A flexible ureteroscope was then inserted without difficulty.  The left ureteral stent was visualized and grasped with the flexible graspers and removed intact without difficulty.  The procedure was well-tolerated.

## 2022-11-21 NOTE — Addendum Note (Signed)
Addended by: Dema Severin on: 11/21/2022 01:02 PM   Modules accepted: Orders

## 2022-11-21 NOTE — Addendum Note (Signed)
Addended by: Dema Severin on: 11/21/2022 02:48 PM   Modules accepted: Orders

## 2022-11-22 LAB — URINE CULTURE: Organism ID, Bacteria: NO GROWTH

## 2022-12-06 ENCOUNTER — Ambulatory Visit (HOSPITAL_BASED_OUTPATIENT_CLINIC_OR_DEPARTMENT_OTHER): Admission: RE | Admit: 2022-12-06 | Payer: Medicaid Other | Source: Ambulatory Visit

## 2022-12-12 ENCOUNTER — Ambulatory Visit (HOSPITAL_BASED_OUTPATIENT_CLINIC_OR_DEPARTMENT_OTHER)
Admission: RE | Admit: 2022-12-12 | Discharge: 2022-12-12 | Disposition: A | Discharge status: Home / Self Care | Payer: Medicaid Other | Source: Ambulatory Visit | Attending: Urology | Admitting: Urology

## 2022-12-12 DIAGNOSIS — N200 Calculus of kidney: Secondary | ICD-10-CM | POA: Insufficient documentation

## 2023-01-02 ENCOUNTER — Ambulatory Visit: Payer: Medicaid Other | Admitting: Urology

## 2023-01-02 ENCOUNTER — Encounter: Payer: Self-pay | Admitting: Urology

## 2023-01-22 IMAGING — CT CT ABD-PELV W/ CM
2 of 4 series · 16 of 46 positions shown, 18 images · IV contrast (Omnipaque or Isovue)
Comparison: CT abdomen pelvis report dated December 21, 2015.

CLINICAL DATA: Right lower quadrant pain for the past 2 days.

EXAM:
CT ABDOMEN AND PELVIS WITH CONTRAST
TECHNIQUE: Multidetector CT imaging of the abdomen and pelvis was performed
using the standard protocol following bolus administration of
intravenous contrast.

[Series 2: axial st · axial · 0.98mm/px · z∈[+787,+1242]mm · 13 of 99 slices shown, 15 images]
[im 4/99  soft-tissue]
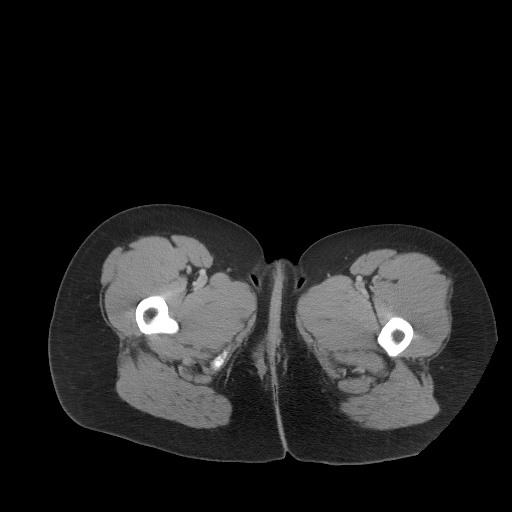
[im 4/99  bone]
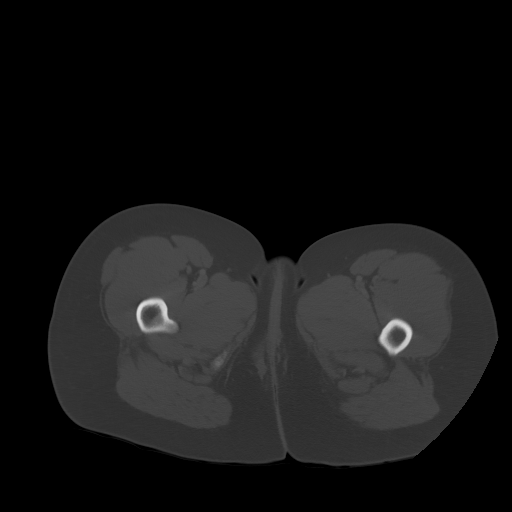
[im 12/99  soft-tissue]
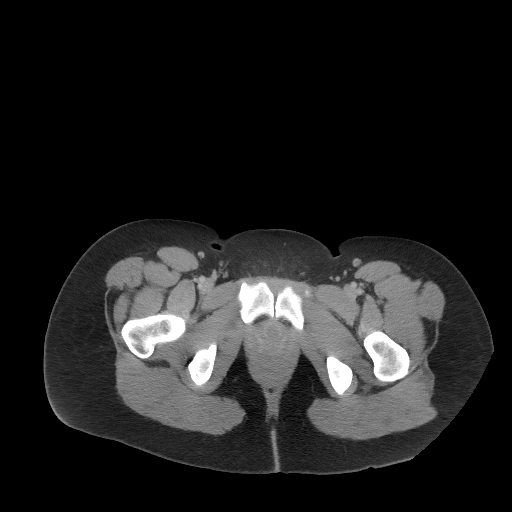
[im 20/99  soft-tissue]
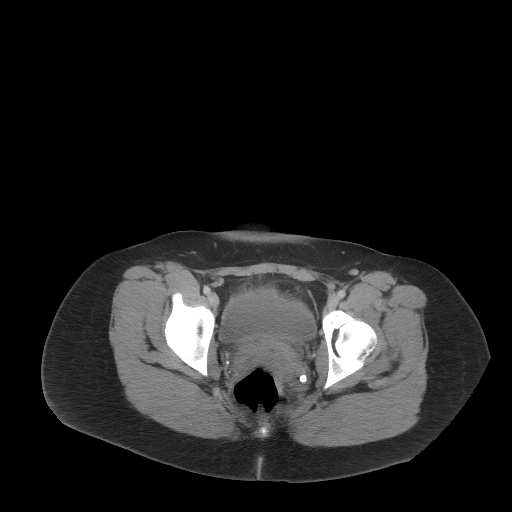
[im 28/99  soft-tissue]
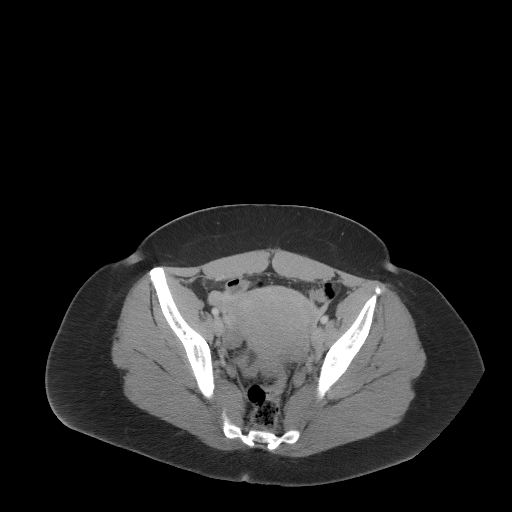
[im 36/99  soft-tissue]
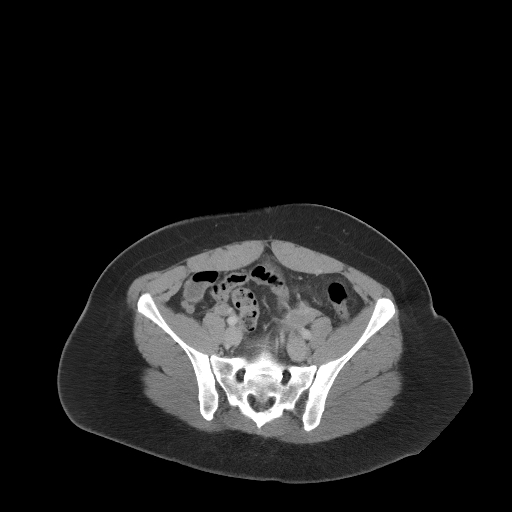
[im 44/99  soft-tissue]
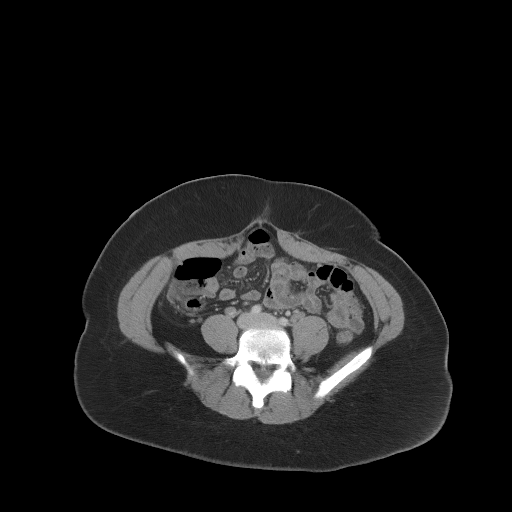
[im 51/99  soft-tissue]
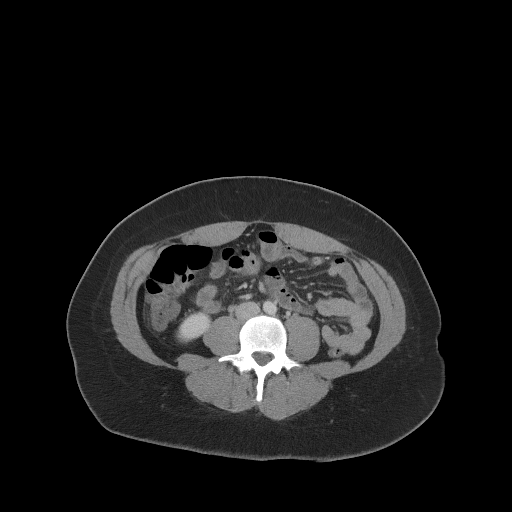
[im 55/99  soft-tissue]
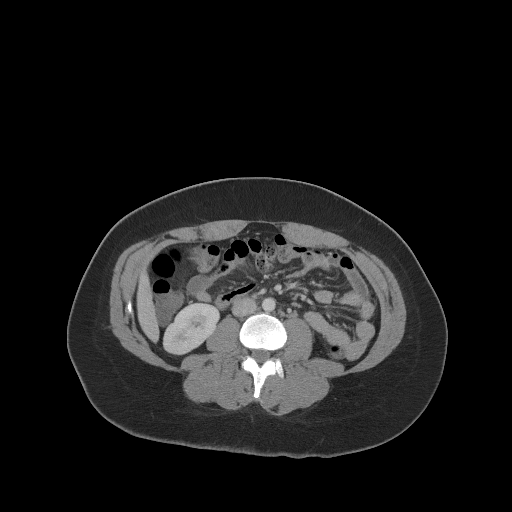
[im 63/99  soft-tissue]
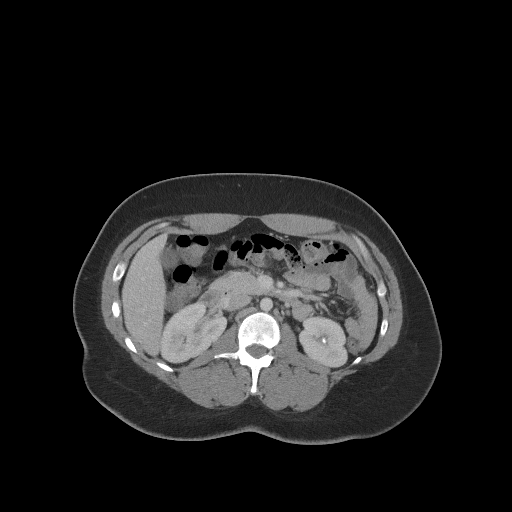
[im 63/99  bone]
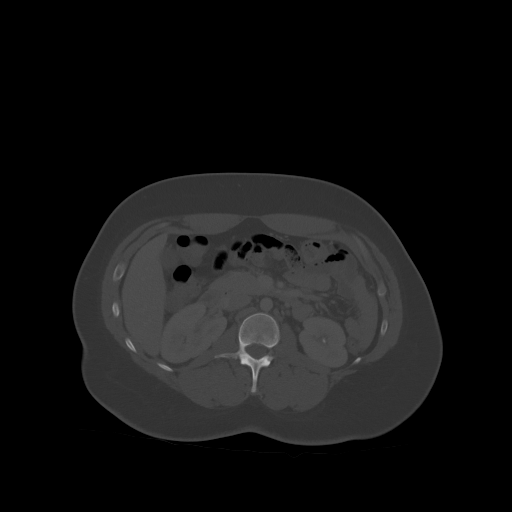
[im 71/99  soft-tissue]
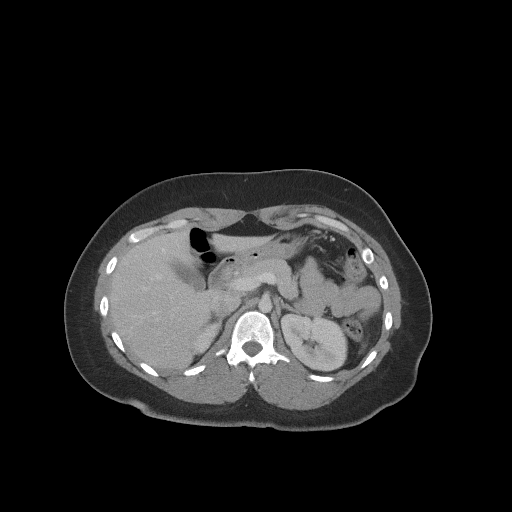
[im 79/99  soft-tissue]
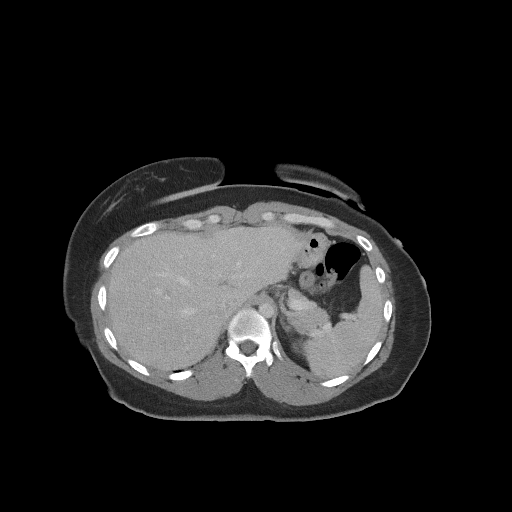
[im 87/99  soft-tissue]
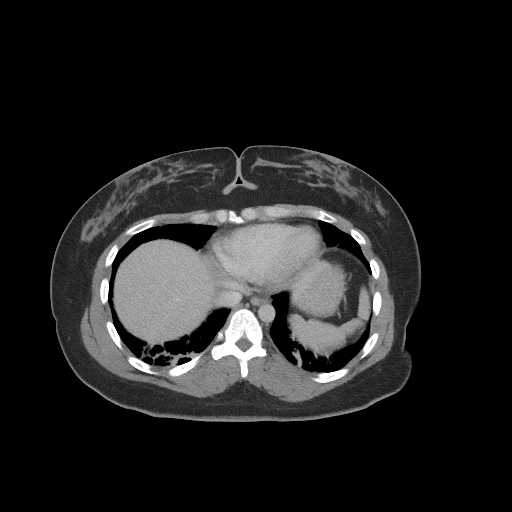
[im 95/99  soft-tissue]
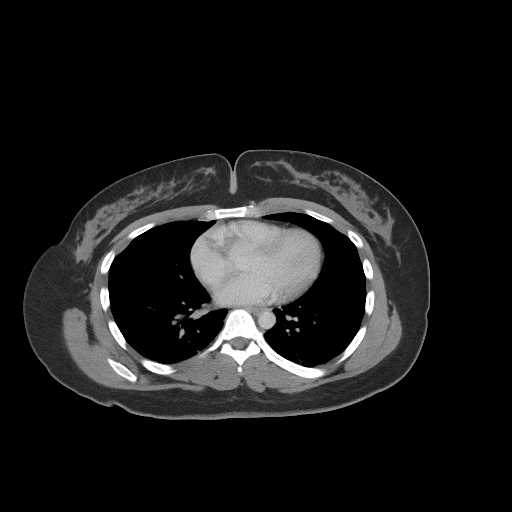

[Series 5: coronal st · coronal · 0.86mm/px · 3 of 120 slices shown]
[im 40/120  soft-tissue]
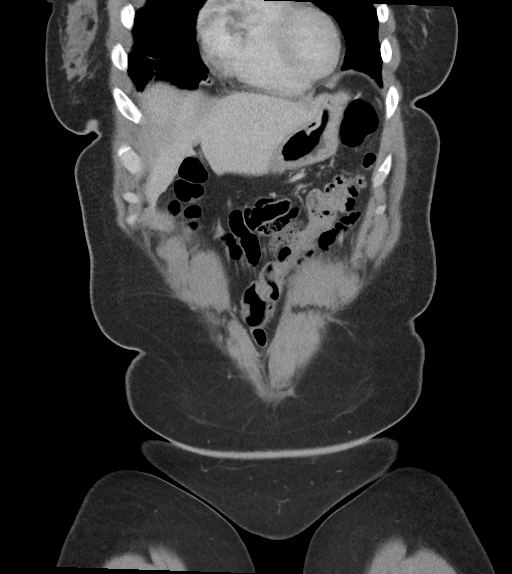
[im 53/120  soft-tissue]
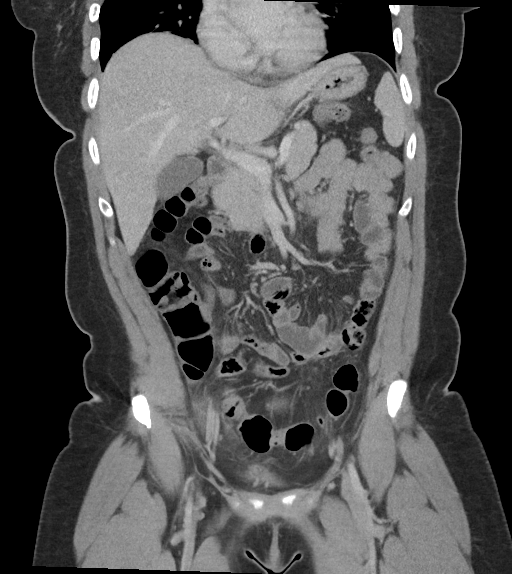
[im 67/120  soft-tissue]
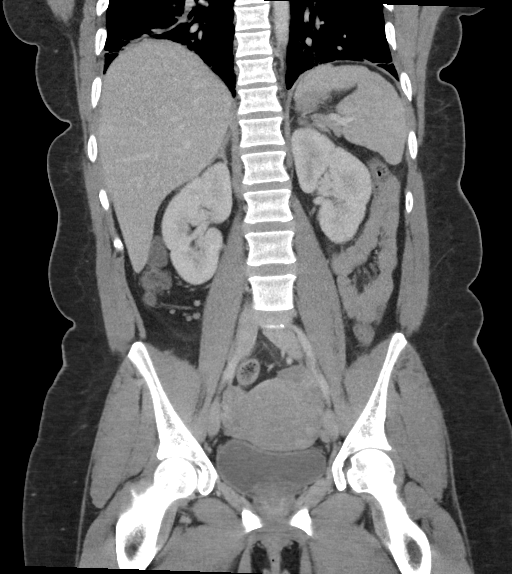

[16 of 46 positions shown; findings below may reference images not displayed]

RADIATION DOSE REDUCTION: This exam was performed according to the
departmental dose-optimization program which includes automated
exposure control, adjustment of the mA and/or kV according to
patient size and/or use of iterative reconstruction technique.

CONTRAST:  100mL OMNIPAQUE IOHEXOL 300 MG/ML  SOLN
FINDINGS: Lower chest: Mild subsegmental atelectasis at both lung bases.

Hepatobiliary: No focal liver abnormality is seen. No gallstones,
gallbladder wall thickening, or biliary dilatation.

Pancreas: Unremarkable. No pancreatic ductal dilatation or
surrounding inflammatory changes.

Spleen: Normal in size without focal abnormality.

Adrenals/Urinary Tract: Adrenal glands and right kidney are
unremarkable. 6 mm calculus in the lower pole of the left kidney. No
ureteral calculi or hydronephrosis. The bladder is unremarkable for
the degree of distention.

Stomach/Bowel: Stomach is within normal limits. Appendix appears
normal. No evidence of bowel wall thickening, distention, or
inflammatory changes.

Vascular/Lymphatic: No significant vascular findings are present. No
enlarged abdominal or pelvic lymph nodes.

Reproductive: Multiple small uterine fibroids.  No adnexal mass.

Other: No abdominal wall hernia or abnormality. No abdominopelvic
ascites. No pneumoperitoneum.

Musculoskeletal: No acute or significant osseous findings.
IMPRESSION: 1. No acute intra-abdominal process. Normal appendix.
2. Nonobstructive left nephrolithiasis.
3. Fibroid uterus.

## 2023-01-22 IMAGING — US US ABDOMEN LIMITED
1 series · 14 of 25 positions shown · non-contrast
Comparison: CT abdomen pelvis dated December 21, 2015.

CLINICAL DATA: Right upper quadrant abdominal pain since yesterday.

EXAM:
ULTRASOUND ABDOMEN LIMITED RIGHT UPPER QUADRANT

[Series 1: us abdomen limited ruq (liver/gb) · 14 of 99 slices shown]
[im 1/99]
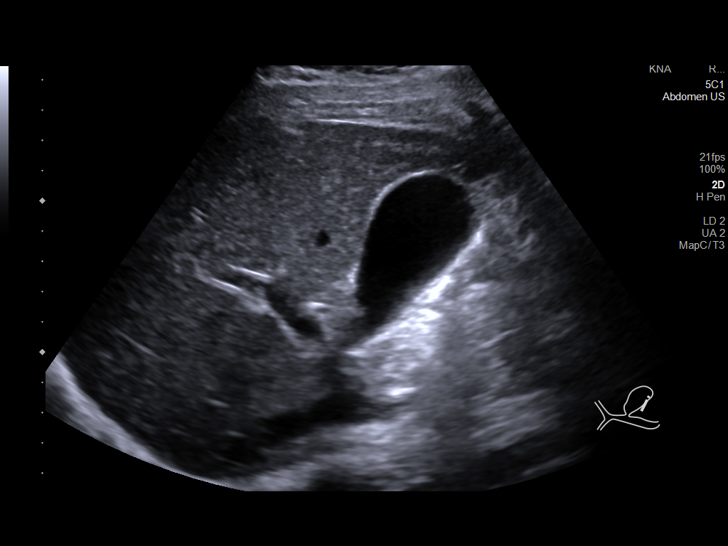
[im 9/99]
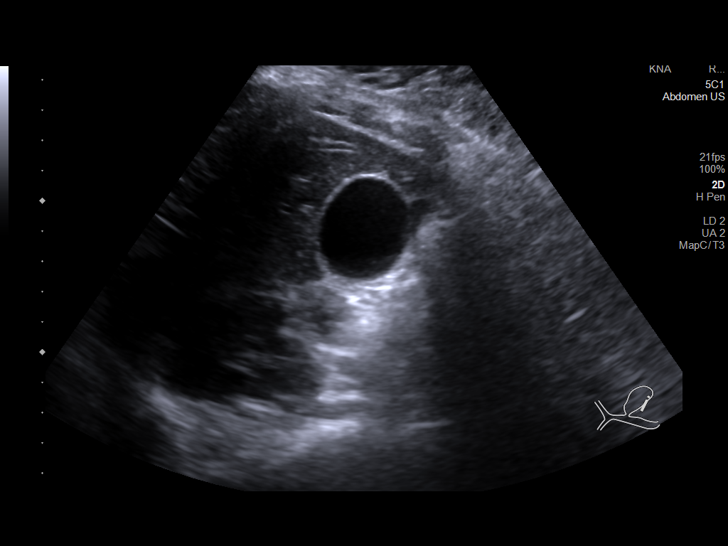
[im 17/99]
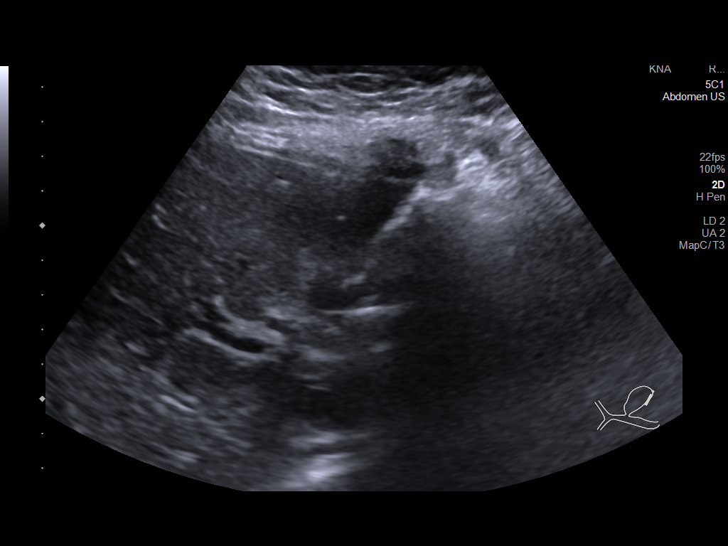
[im 25/99]
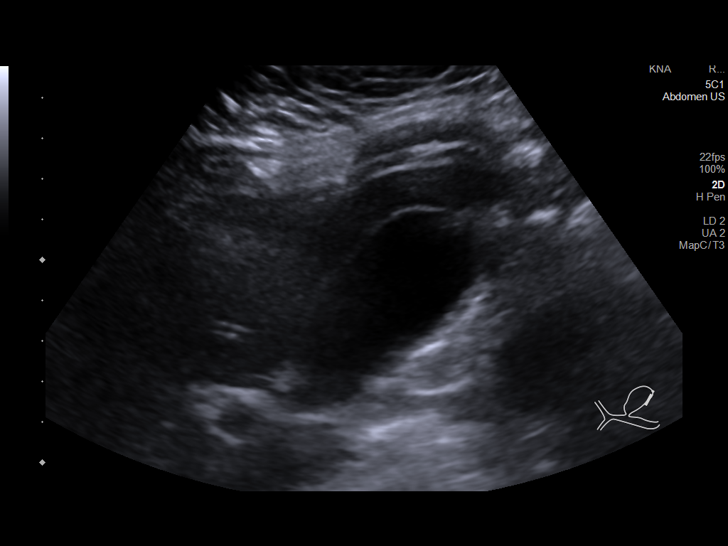
[im 33/99]
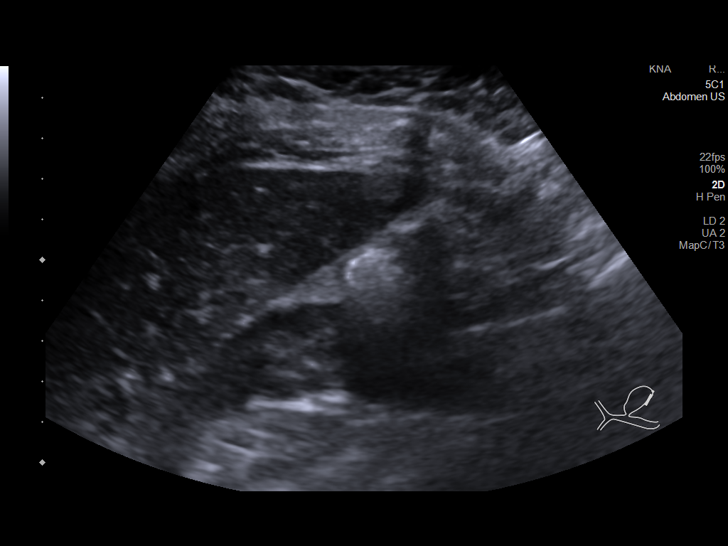
[im 37/99]
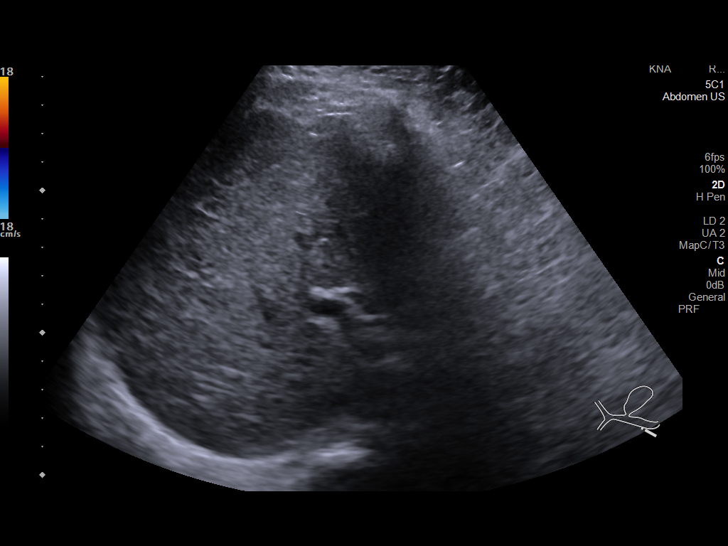
[im 45/99]
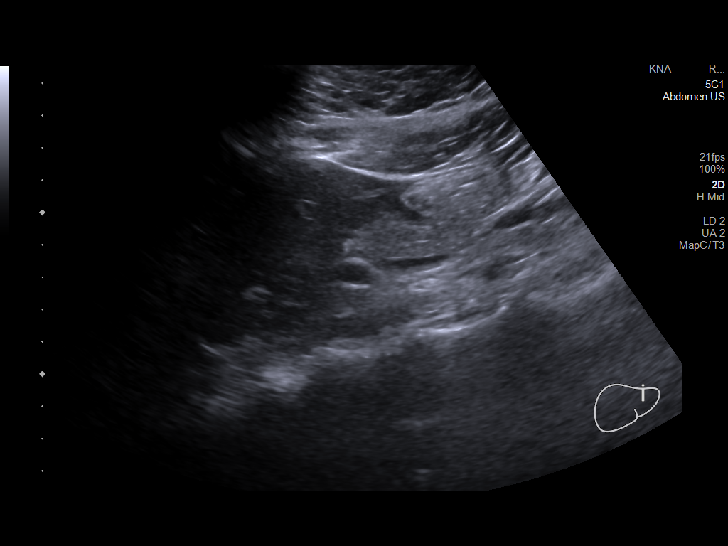
[im 54/99]
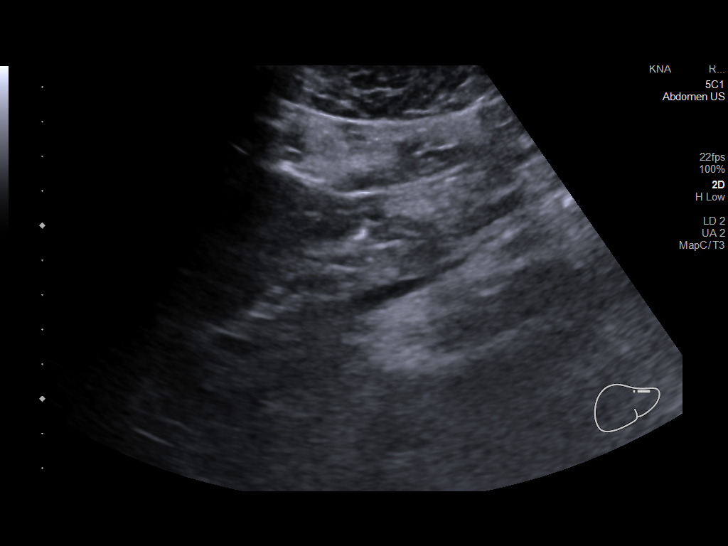
[im 62/99]
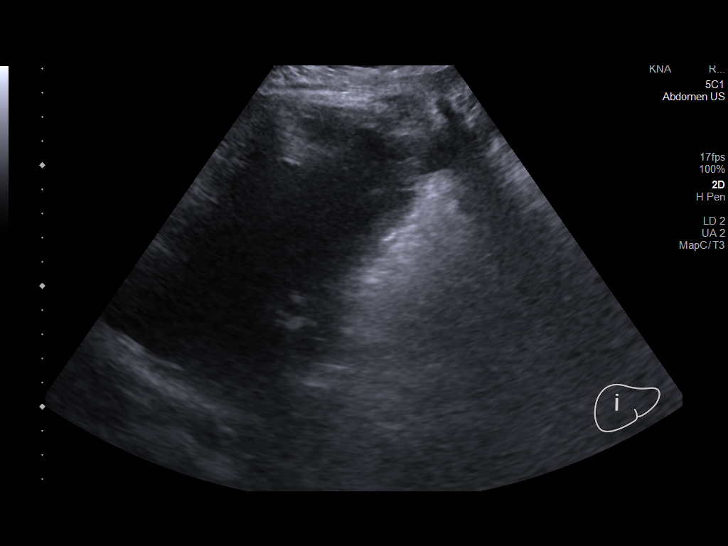
[im 66/99]
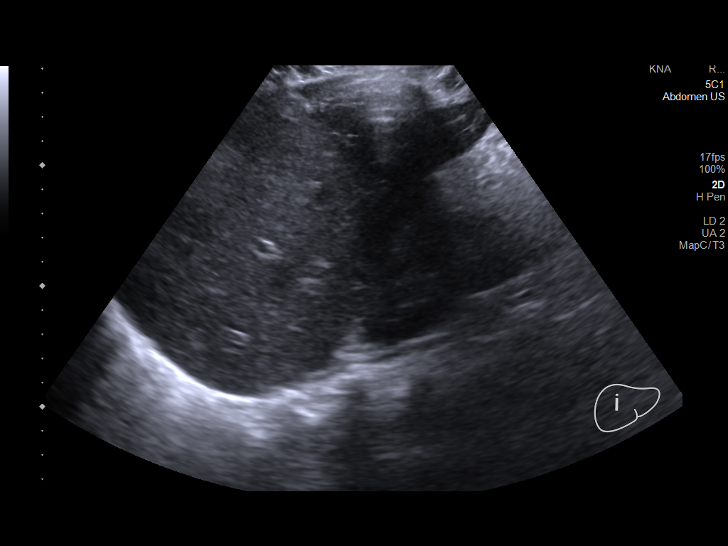
[im 74/99]
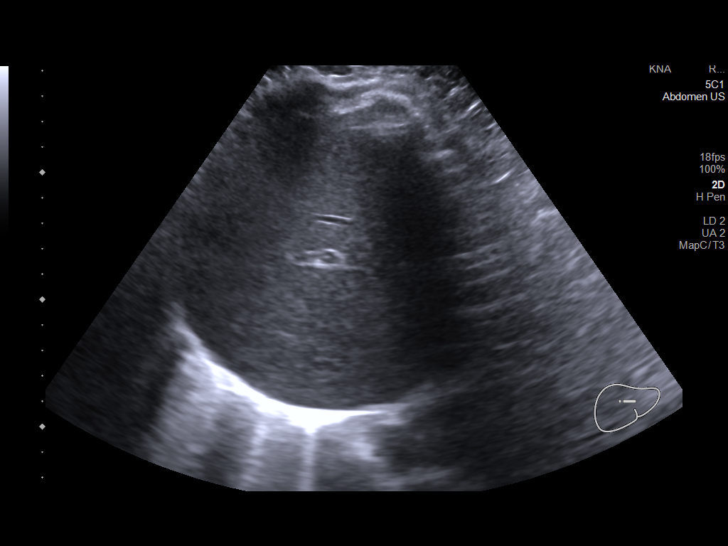
[im 82/99]
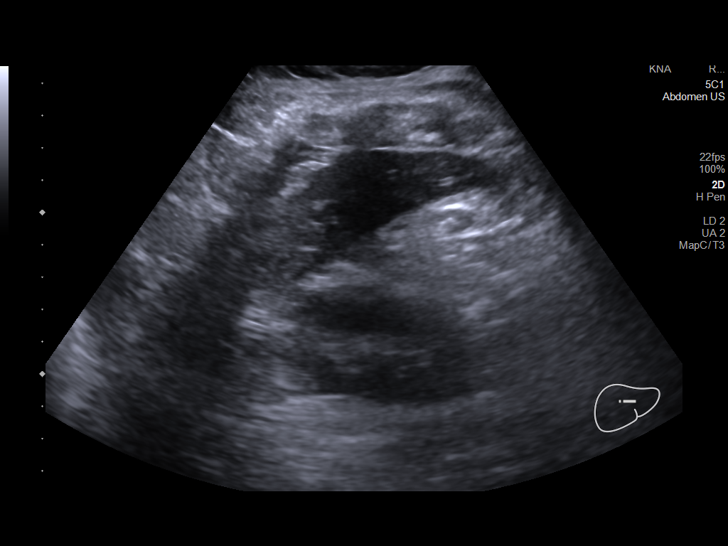
[im 90/99]
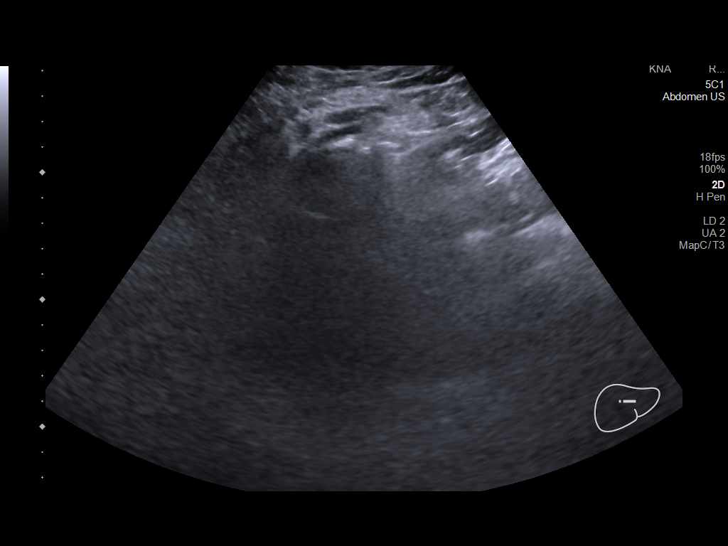
[im 99/99]
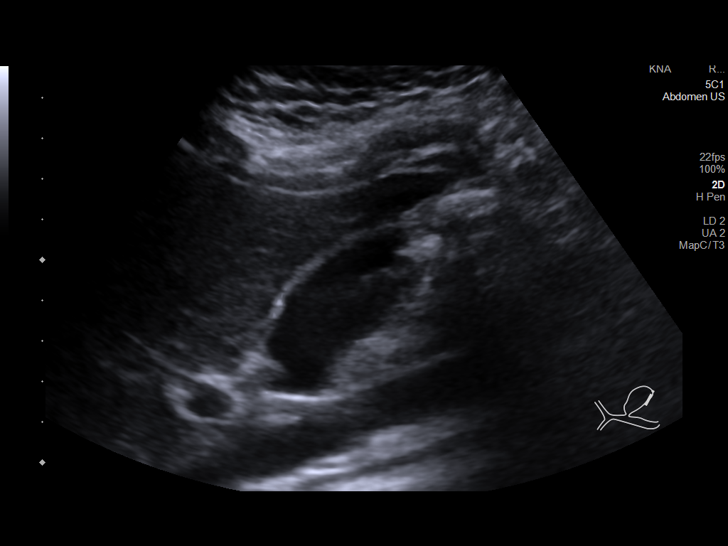

[14 of 25 positions shown; findings below may reference images not displayed]

FINDINGS: Gallbladder:

No gallstones or wall thickening visualized. No sonographic Murphy
sign noted by sonographer.

Common bile duct:

Diameter: 3 mm, normal.

Liver:

No focal lesion identified. Within normal limits in parenchymal
echogenicity. Portal vein is patent on color Doppler imaging with
normal direction of blood flow towards the liver.

Other: None.
IMPRESSION: 1. Normal right upper quadrant ultrasound.

## 2023-01-30 ENCOUNTER — Other Ambulatory Visit: Payer: Self-pay

## 2023-01-31 ENCOUNTER — Encounter: Payer: Self-pay | Admitting: Obstetrics and Gynecology

## 2023-01-31 ENCOUNTER — Other Ambulatory Visit (HOSPITAL_COMMUNITY)
Admission: RE | Admit: 2023-01-31 | Discharge: 2023-01-31 | Disposition: A | Discharge status: Home / Self Care | Payer: Medicaid Other | Source: Ambulatory Visit | Attending: Obstetrics and Gynecology | Admitting: Obstetrics and Gynecology

## 2023-01-31 ENCOUNTER — Ambulatory Visit (INDEPENDENT_AMBULATORY_CARE_PROVIDER_SITE_OTHER): Payer: Medicaid Other | Admitting: Obstetrics and Gynecology

## 2023-01-31 VITALS — BP 114/80 | HR 86 | Ht 64.0 in | Wt 215.0 lb

## 2023-01-31 DIAGNOSIS — Z1331 Encounter for screening for depression: Secondary | ICD-10-CM

## 2023-01-31 DIAGNOSIS — N939 Abnormal uterine and vaginal bleeding, unspecified: Secondary | ICD-10-CM | POA: Diagnosis not present

## 2023-01-31 DIAGNOSIS — Z01419 Encounter for gynecological examination (general) (routine) without abnormal findings: Secondary | ICD-10-CM | POA: Diagnosis present

## 2023-01-31 DIAGNOSIS — Z113 Encounter for screening for infections with a predominantly sexual mode of transmission: Secondary | ICD-10-CM | POA: Diagnosis present

## 2023-01-31 MED ORDER — VITAMIN D (ERGOCALCIFEROL) 1.25 MG (50000 UNIT) PO CAPS: 50000.0000 [IU] | ORAL_CAPSULE | ORAL | 0 refills | Status: AC

## 2023-01-31 NOTE — Patient Instructions (Addendum)
[email or web address]  It was nice meeting you today. Please let me know what you would like to do after we get your test results back. Our goal is to have your bleeding under control by the end of the year (6 months).

## 2023-01-31 NOTE — Progress Notes (Signed)
37 y.o New GYN presents for irregular periods. Pt bled from January to April, received Depo Injection 12/22/22 which stopped the bleeding, now she is only spotting.  Last PAP 12/21/21

## 2023-01-31 NOTE — Progress Notes (Signed)
   NEW GYNECOLOGY VISIT  Subjective:  Emma Howard is a 37 y.o. [phone] on Depo Provera presenting for AUB  Symptoms started early 2023. Having regular heavy periods. Started Depo ~1 year ago & reports good adherence w/ no missed/late doses. Now has irregular heavy bleeding, passes clots. 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 Daily bleeding from Jan-April of this year, got Depo 4/26 and heavy bleeding resolved. Continues to have intermittent spotting. Felt like a lot of her irregular bleeding was due to kidney stones & pyelonephritis. Is certain this is vaginal bleeding not blood in her urine. No pain w/ periods.  Had similar episode of bleeding in 2011/2012. Was diagnosed with fibroids. Bleeding resolved with Depo.   I personally reviewed the following: Pap 12/21/21 HPV neg 08/23/22 CT A/P unremarkable uterus & bilateral adnexa 12/05/22 Note from PCP Dr. Charlynne Pander 12/05/22 Hgb 11.7, ferritin 8  PMH: denies. Chart review - anx/dep, anemia, fibroid, GDM PSH: lithotripsy/cysto (11/13/22) Meds: vit D All: NKDA OB: SVD x4, ABx3 Gyn Hx: denies hx STI, PID, or abnormal paps. Sexually active w/ female partner Soc: Denies t/e/d  Objective:   Vitals:   01/31/23 0821  Weight: 215 lb (97.5 kg)  Height: 5\' 4"  (1.626 m)   General:  Alert, oriented and cooperative. Patient is in no acute distress.  Skin: Skin is warm and dry. No rash noted.   Cardiovascular: Normal heart rate noted  Respiratory: Normal respiratory effort, no problems with respiration noted  Abdomen: Soft, non-tender, non-distended   Pelvic: NEFG. Cervix normal texture, non tender. Uterus normal size, mobile, nontender. No palpable adnexal masses, no adnexal tenderness.   Exam performed in the presence of a chaperone  Assessment and Plan:  Bobbie Robeck is a 37 y.o. with AUB  Abnormal uterine bleeding (AUB) - We reviewed a focused differential diagnosis for AUB that includes, but is not limited to  endometrial/endocervical polyps, fibroids, endometrial hyperplasia/endometrial cancer, medication side effect, and thyroid disease.  - Most likely etiologies include iatrogenic (Depo) and fibroid.  - Pap & CBC previously normal - GC/CT/trich, TSH & pelvic ultrasound ordered - Discussed possibility of EMB if symptoms persistent or no etiology found  - We reviewed management options including COCPs (continuous & cyclic), patch, ring, progestins (continuous & cyclic), DMPA (Depo Provera), LNG-IUD (Mirena), and Lysteda (TXA) - She is leaning IUD but would like to think about it -     TSH -     US PELVIC COMPLETE WITH TRANSVAGINAL; Future -     Cervicovaginal ancillary only( [place])  Routine screening for STI (sexually transmitted infection) -     Hepatitis C Antibody -     HIV antibody (with reflex) -     RPR -     Hepatitis B Surface AntiGEN -     Cervicovaginal ancillary only( [place])  Positive depression screening -     Ambulatory referral to [facility]  RTC pending ultrasound results.  Lennart Pall, MD

## 2023-02-01 LAB — CERVICOVAGINAL ANCILLARY ONLY
Bacterial Vaginitis (gardnerella): POSITIVE — AB
Candida Glabrata: NEGATIVE
Candida Vaginitis: NEGATIVE
Chlamydia: NEGATIVE
Comment: NEGATIVE
Comment: NEGATIVE
Comment: NEGATIVE
Comment: NEGATIVE
Comment: NEGATIVE
Comment: NORMAL
Neisseria Gonorrhea: NEGATIVE
Trichomonas: NEGATIVE

## 2023-02-02 ENCOUNTER — Ambulatory Visit (HOSPITAL_BASED_OUTPATIENT_CLINIC_OR_DEPARTMENT_OTHER): Admission: RE | Admit: 2023-02-02 | Payer: Medicaid Other | Source: Ambulatory Visit

## 2023-02-02 LAB — RPR, QUANT+TP ABS (REFLEX)
Rapid Plasma Reagin, Quant: 1:1 {titer} — ABNORMAL HIGH
T Pallidum Abs: NONREACTIVE

## 2023-02-02 LAB — TSH: TSH: 0.312 u[IU]/mL — ABNORMAL LOW (ref 0.450–4.500)

## 2023-02-02 LAB — RPR: RPR Ser Ql: REACTIVE — AB

## 2023-02-02 LAB — HIV ANTIBODY (ROUTINE TESTING W REFLEX): HIV Screen 4th Generation wRfx: NONREACTIVE

## 2023-02-02 LAB — HEPATITIS C ANTIBODY: Hep C Virus Ab: NONREACTIVE

## 2023-02-02 LAB — HEPATITIS B SURFACE ANTIGEN: Hepatitis B Surface Ag: NEGATIVE

## 2023-02-07 ENCOUNTER — Telehealth: Payer: Self-pay | Admitting: Emergency Medicine

## 2023-02-07 NOTE — Telephone Encounter (Signed)
Attempted TC to patient, left voicemail. Patient will need to come in for additional blood draw. It is past 7 days, and lab is not able to add on lab.

## 2023-02-07 NOTE — Telephone Encounter (Signed)
-----   Message from Lennart Pall, MD sent at 02/07/2023  2:56 PM EDT ----- Can we ask lab to add on free T4 and total T3 to help guide next steps? If it can't be added on, the pt will need to come back for those labs. Thank you! - KF

## 2023-02-09 ENCOUNTER — Other Ambulatory Visit: Payer: Medicaid Other

## 2023-02-09 DIAGNOSIS — N939 Abnormal uterine and vaginal bleeding, unspecified: Secondary | ICD-10-CM

## 2023-02-10 LAB — T3: T3, Total: 113 ng/dL (ref 71–180)

## 2023-02-10 LAB — T4, FREE: Free T4: 1.11 ng/dL (ref 0.82–1.77)

## 2023-04-04 ENCOUNTER — Ambulatory Visit (HOSPITAL_COMMUNITY): Payer: Medicaid Other

## 2023-04-17 ENCOUNTER — Ambulatory Visit (HOSPITAL_COMMUNITY): Payer: Medicaid Other | Attending: Obstetrics and Gynecology

## 2023-12-03 ENCOUNTER — Other Ambulatory Visit: Payer: Self-pay

## 2023-12-03 ENCOUNTER — Ambulatory Visit (HOSPITAL_COMMUNITY): Payer: Self-pay | Attending: Internal Medicine

## 2023-12-03 ENCOUNTER — Encounter (HOSPITAL_COMMUNITY): Payer: Self-pay

## 2023-12-03 DIAGNOSIS — M6281 Muscle weakness (generalized): Secondary | ICD-10-CM | POA: Insufficient documentation

## 2023-12-03 DIAGNOSIS — M5442 Lumbago with sciatica, left side: Secondary | ICD-10-CM | POA: Diagnosis present

## 2023-12-03 DIAGNOSIS — G8929 Other chronic pain: Secondary | ICD-10-CM | POA: Insufficient documentation
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
## 2023-12-10 ENCOUNTER — Ambulatory Visit (HOSPITAL_COMMUNITY)

## 2023-12-10 ENCOUNTER — Encounter (HOSPITAL_COMMUNITY): Payer: Self-pay

## 2023-12-10 DIAGNOSIS — M6281 Muscle weakness (generalized): Secondary | ICD-10-CM

## 2023-12-10 DIAGNOSIS — G8929 Other chronic pain: Secondary | ICD-10-CM
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
## 2023-12-26 ENCOUNTER — Ambulatory Visit (HOSPITAL_COMMUNITY)

## 2023-12-26 ENCOUNTER — Encounter (HOSPITAL_COMMUNITY): Payer: Self-pay

## 2023-12-26 DIAGNOSIS — G8929 Other chronic pain: Secondary | ICD-10-CM

## 2023-12-26 DIAGNOSIS — M6281 Muscle weakness (generalized): Secondary | ICD-10-CM
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
## 2024-01-02 ENCOUNTER — Ambulatory Visit (HOSPITAL_COMMUNITY): Attending: Internal Medicine

## 2024-01-02 DIAGNOSIS — M6281 Muscle weakness (generalized): Secondary | ICD-10-CM | POA: Insufficient documentation

## 2024-01-02 DIAGNOSIS — G8929 Other chronic pain: Secondary | ICD-10-CM | POA: Insufficient documentation

## 2024-01-02 DIAGNOSIS — M5442 Lumbago with sciatica, left side: Secondary | ICD-10-CM | POA: Diagnosis present
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
## 2024-01-09 ENCOUNTER — Encounter (HOSPITAL_COMMUNITY)

## 2024-01-09 ENCOUNTER — Telehealth (HOSPITAL_COMMUNITY): Payer: Self-pay

## 2024-01-09 ENCOUNTER — Encounter (HOSPITAL_COMMUNITY): Payer: Self-pay

## 2024-01-09 NOTE — Telephone Encounter (Signed)
 No show #1, called and left message concerning missed apt today. Included next apt date and time with contact number included if needs to cancel or reschedule apts in the future.   Minor Amble, LPTA/CLT; Johnye Napoleon [phone]

## 2024-01-09 NOTE — Therapy (Signed)
 [facility] at [facility] [address] [place], [place], [address] Phone: [phone]   Fax:  [phone]  Patient Details  Name: Emma Howard MRN: [id number] Date of Birth: 02/17/1986 Referring Provider:  No ref. provider found  PHYSICAL THERAPY DISCHARGE SUMMARY  Visits from Start of Care: 4  Current functional level related to goals / functional outcomes: unknown   Remaining deficits: unknown   Education / Equipment: HEP   Patient agrees to discharge. Patient goals were not met. Patient is being discharged due to the patient's request.   [facility] Outpatient Rehabilitation at [facility] [address] [place], [place], [address] Phone: [phone]   Fax:  [phone]

## 2024-01-16 ENCOUNTER — Encounter (HOSPITAL_COMMUNITY)

## 2024-01-23 ENCOUNTER — Encounter (HOSPITAL_COMMUNITY): Admitting: Physical Therapy

## 2024-01-30 ENCOUNTER — Encounter (HOSPITAL_COMMUNITY)

## 2024-02-06 ENCOUNTER — Encounter (HOSPITAL_COMMUNITY)
